# Patient Record
Sex: Male | Born: 1960 | Race: White | Hispanic: No | Marital: Married | Smoking: Never smoker
Health system: Southern US, Community
[De-identification: ages and names within clinical notes are randomized; demographics above are authoritative.]

## PROBLEM LIST (undated history)

## (undated) DIAGNOSIS — I10 Essential (primary) hypertension: Secondary | ICD-10-CM

## (undated) DIAGNOSIS — N200 Calculus of kidney: Secondary | ICD-10-CM

## (undated) HISTORY — PX: OTHER SURGICAL HISTORY: SHX169

## (undated) HISTORY — PX: TONSILLECTOMY: SUR1361

## (undated) HISTORY — PX: CHOLECYSTECTOMY: SHX55

---

## 2004-12-27 ENCOUNTER — Emergency Department: Payer: Self-pay | Admitting: General Practice

## 2005-01-05 ENCOUNTER — Emergency Department: Payer: Self-pay | Admitting: Emergency Medicine

## 2005-01-17 ENCOUNTER — Ambulatory Visit: Payer: Self-pay | Admitting: Specialist

## 2005-11-30 ENCOUNTER — Other Ambulatory Visit: Payer: Self-pay

## 2005-11-30 ENCOUNTER — Emergency Department: Payer: Self-pay | Admitting: Emergency Medicine

## 2005-12-03 ENCOUNTER — Ambulatory Visit: Payer: Self-pay | Admitting: Emergency Medicine

## 2006-06-16 IMAGING — CT CT ABD-PELV W/O CM
1 of 3 series · 15 of 32 positions shown, 19 images · non-contrast
Comparison: none

REASON FOR EXAM: Stone study
COMMENTS:

[Series 3: inspace · axial · 0.81mm/px · z∈[-524,-68]mm · 15 of 712 slices shown, 19 images]
[im 30/712  soft-tissue]
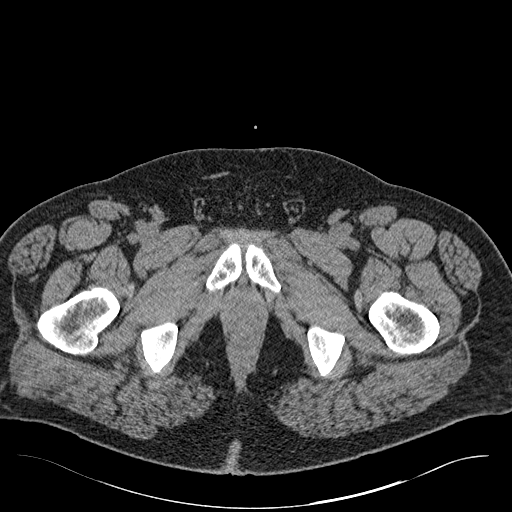
[im 30/712  bone]
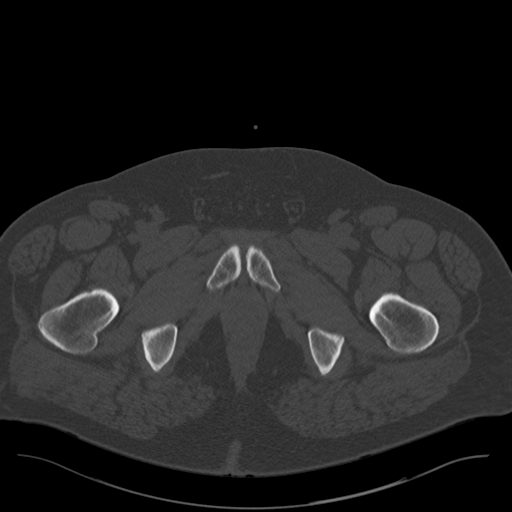
[im 89/712  soft-tissue]
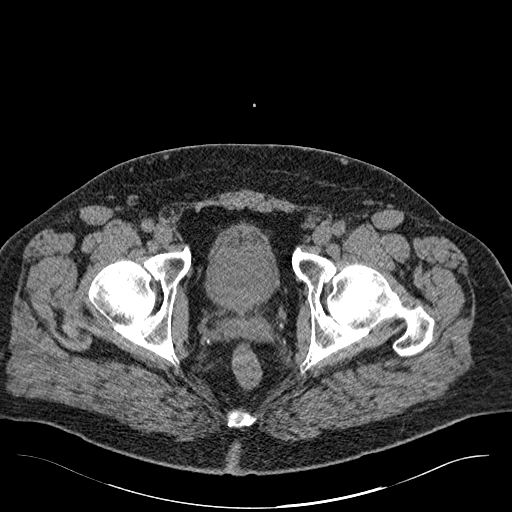
[im 149/712  soft-tissue]
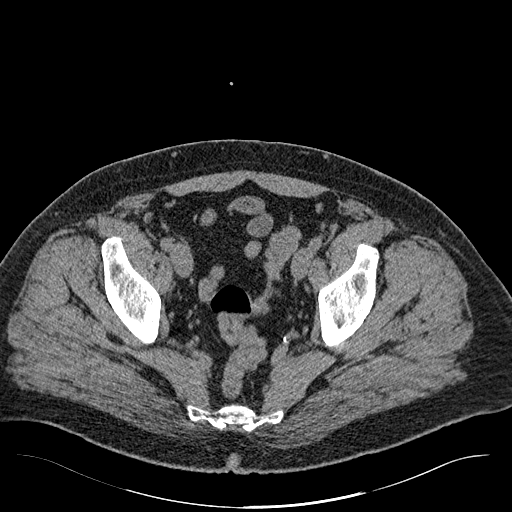
[im 208/712  soft-tissue]
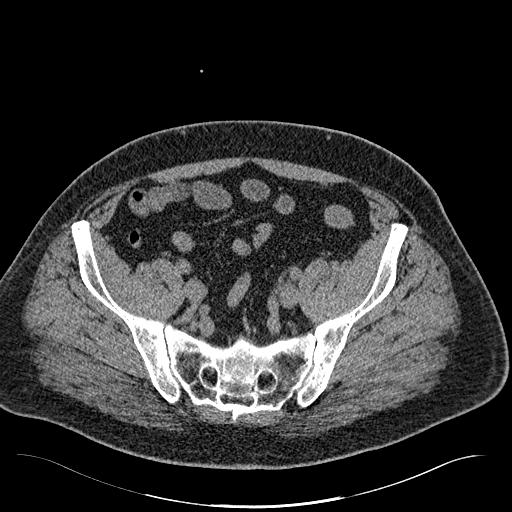
[im 238/712  soft-tissue]
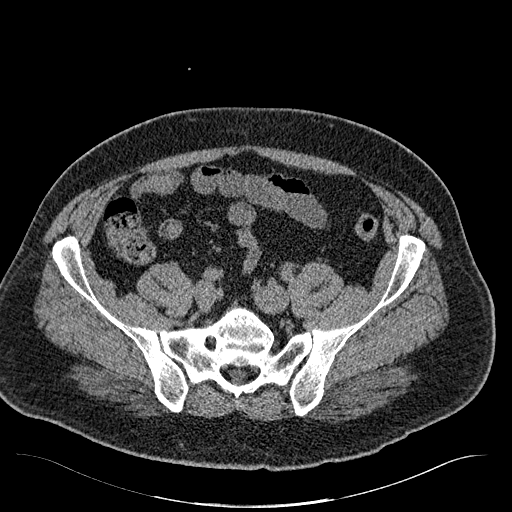
[im 297/712  soft-tissue]
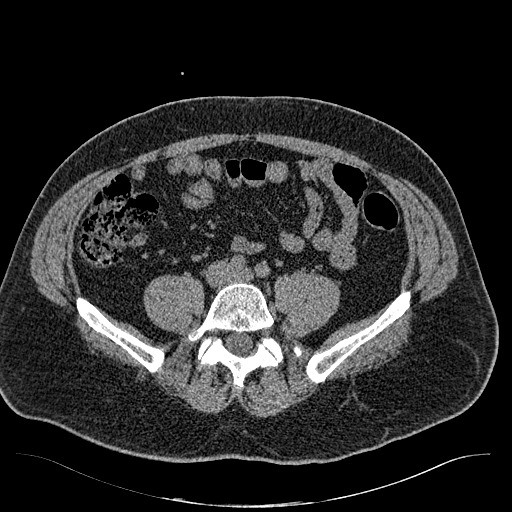
[im 356/712  soft-tissue]
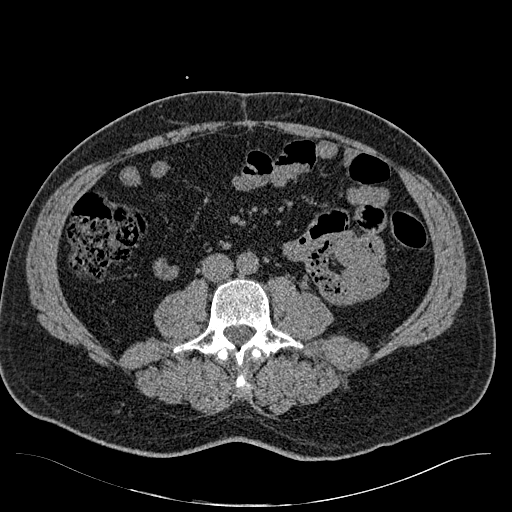
[im 415/712  soft-tissue]
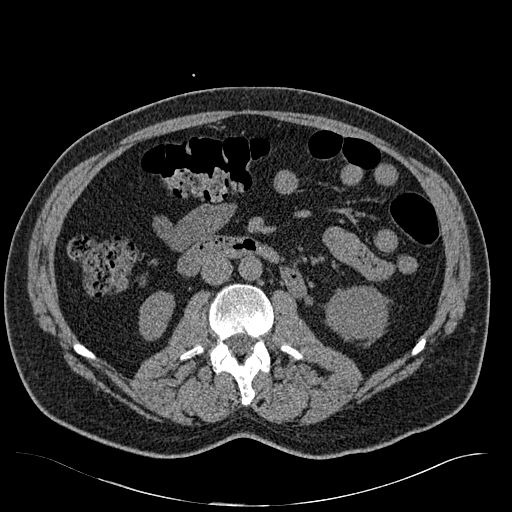
[im 475/712  soft-tissue]
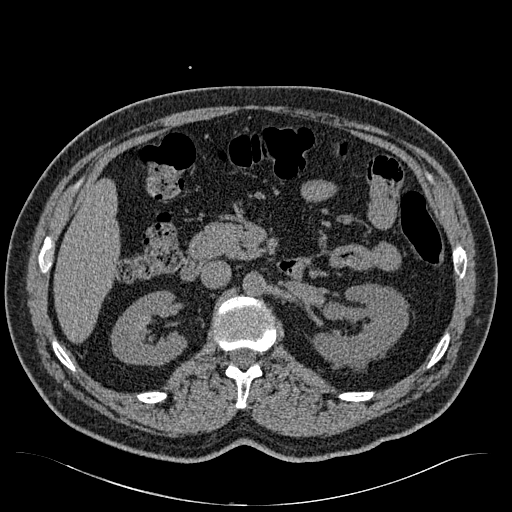
[im 475/712  bone]
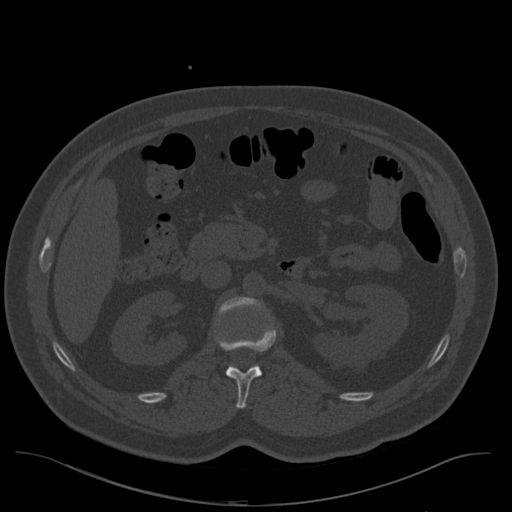
[im 504/712  soft-tissue]
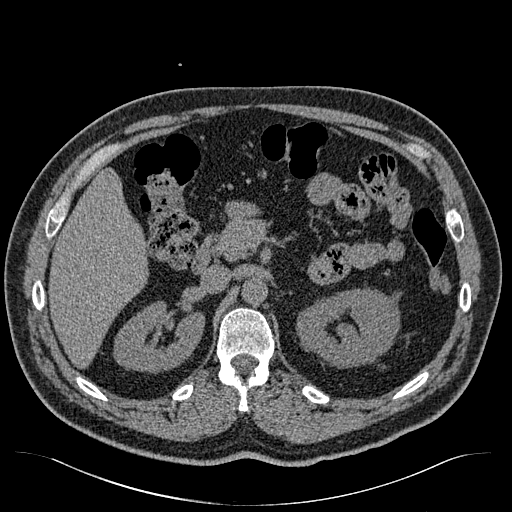
[im 563/712  soft-tissue]
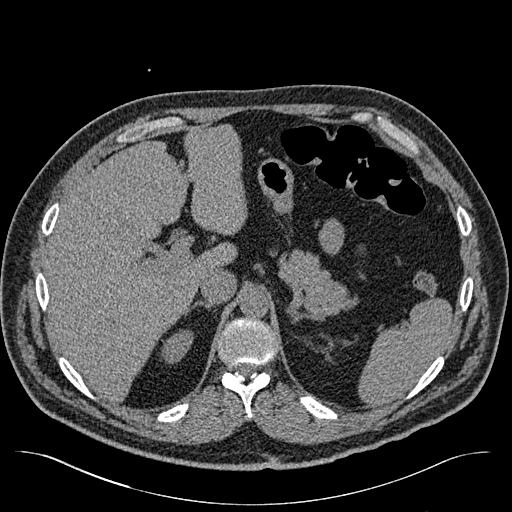
[im 593/712  lung]
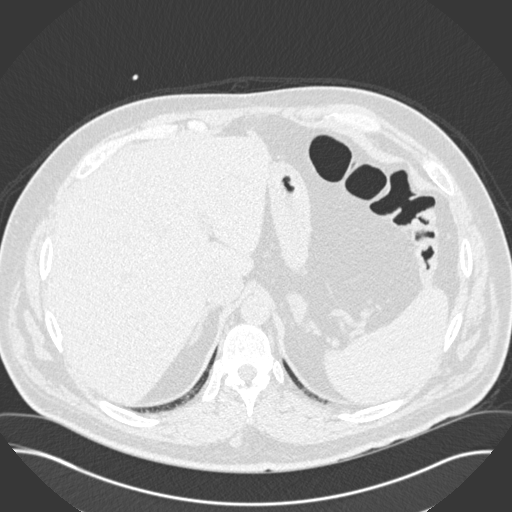
[im 623/712  soft-tissue]
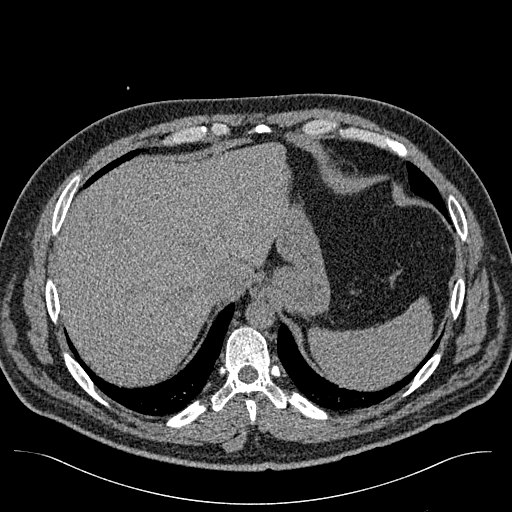
[im 623/712  lung]
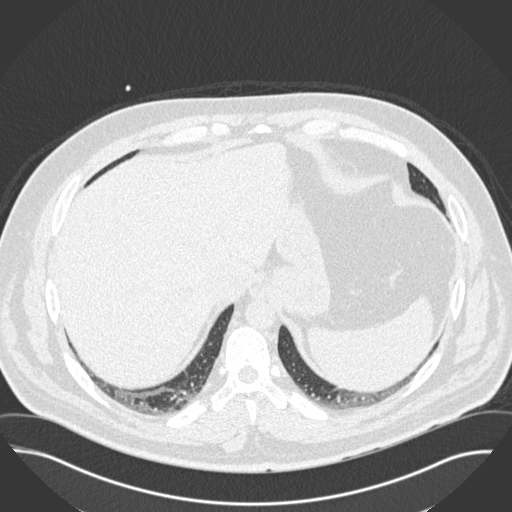
[im 652/712  lung]
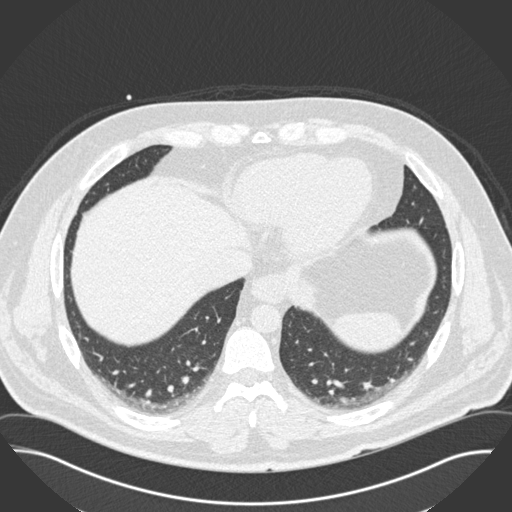
[im 682/712  soft-tissue]
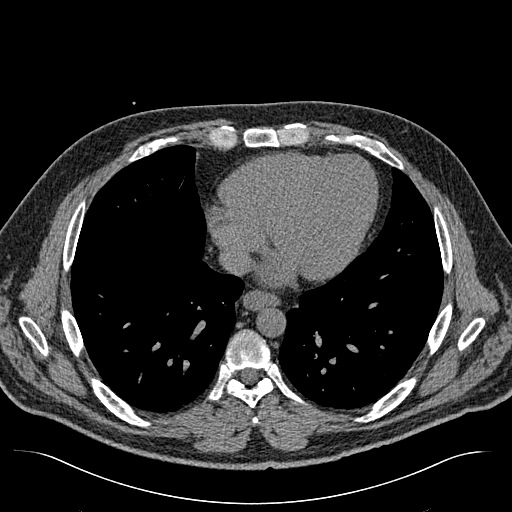
[im 682/712  lung]
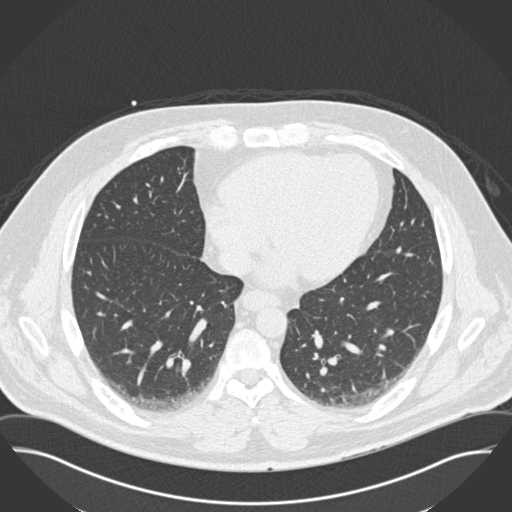

[15 of 32 positions shown; findings below may reference images not displayed]

PROCEDURE:     CT  - CT ABDOMEN AND PELVIS W[DATE]  [DATE]

RESULT:     3-mm unenhanced cuts through the abdomen and pelvis were
performed.  The lung bases show some dependent atelectasis but are otherwise
clear.  Soft tissue windows show solid organs aside from the kidneys to be
free of abnormality though the evaluation of them is somewhat limited due to
lack of IV contrast.  Both kidneys show evidence of bilateral
non-obstructing 1 to 2 mm corticomedullary junction calcifications.  The
RIGHT kidney does not show evidence of hydronephrosis or hydroureter.  The
LEFT kidney, however, does show evidence of grade 2 hydronephrosis and
hydroureter.  Image #115 shows evidence of a 3-mm calcification likely
present within the mid-LEFT ureter.  This is likely causing the obstructive
signs identified around the LEFT kidney.  No free intraperitoneal fluid,
air, or adenopathy is noted.  The bladder distends normally without evidence
of filling defect or wall thickening.  Punctate calcifications are noted
throughout the pelvis but these are felt to be extrinsic to either distal
ureter.  No inguinal mass or adenopathy is noted. No evidence of
diverticular disease is seen in the sigmoid colon.  There is no free pelvic
fluid or air.
IMPRESSION: Image #115 shows a 3-mm calcification present within the mid to distal LEFT
ureter causing grade 2 hydronephrosis, hydroureter, and some subtle
perinephric stranding.

Bilateral non-obstructing 1 to 2 mm calcifications are noted in each kidney.

No free intraperitoneal fluid, air, or adenopathy.

## 2006-09-29 ENCOUNTER — Emergency Department: Payer: Self-pay | Admitting: Emergency Medicine

## 2006-09-29 ENCOUNTER — Other Ambulatory Visit: Payer: Self-pay

## 2006-12-10 ENCOUNTER — Ambulatory Visit: Payer: Self-pay | Admitting: Gastroenterology

## 2007-03-10 ENCOUNTER — Ambulatory Visit: Payer: Self-pay | Admitting: Internal Medicine

## 2009-10-16 ENCOUNTER — Ambulatory Visit: Payer: Self-pay

## 2009-11-19 ENCOUNTER — Emergency Department: Payer: Self-pay | Admitting: Internal Medicine

## 2015-10-26 ENCOUNTER — Emergency Department
Admission: EM | Admit: 2015-10-26 | Discharge: 2015-10-27 | Disposition: A | Discharge status: Home / Self Care | Payer: PRIVATE HEALTH INSURANCE | Attending: Emergency Medicine | Admitting: Emergency Medicine

## 2015-10-26 DIAGNOSIS — F10120 Alcohol abuse with intoxication, uncomplicated: Secondary | ICD-10-CM | POA: Diagnosis not present

## 2015-10-26 DIAGNOSIS — R197 Diarrhea, unspecified: Secondary | ICD-10-CM | POA: Insufficient documentation

## 2015-10-26 DIAGNOSIS — Z79899 Other long term (current) drug therapy: Secondary | ICD-10-CM | POA: Diagnosis not present

## 2015-10-26 DIAGNOSIS — R5383 Other fatigue: Secondary | ICD-10-CM | POA: Insufficient documentation

## 2015-10-26 DIAGNOSIS — R55 Syncope and collapse: Secondary | ICD-10-CM | POA: Diagnosis present

## 2015-10-26 DIAGNOSIS — I10 Essential (primary) hypertension: Secondary | ICD-10-CM | POA: Diagnosis not present

## 2015-10-26 DIAGNOSIS — F10920 Alcohol use, unspecified with intoxication, uncomplicated: Secondary | ICD-10-CM

## 2015-10-27 ENCOUNTER — Emergency Department: Payer: PRIVATE HEALTH INSURANCE

## 2015-10-27 LAB — CBC
HCT: 44.3 % (ref 40.0–52.0)
HEMOGLOBIN: 14.8 g/dL (ref 13.0–18.0)
MCH: 30.7 pg (ref 26.0–34.0)
MCHC: 33.3 g/dL (ref 32.0–36.0)
MCV: 92.2 fL (ref 80.0–100.0)
Platelets: 222 10*3/uL (ref 150–440)
RBC: 4.81 MIL/uL (ref 4.40–5.90)
RDW: 12.3 % (ref 11.5–14.5)
WBC: 6.4 10*3/uL (ref 3.8–10.6)

## 2015-10-27 LAB — CARBOXYHEMOGLOBIN
Carboxyhemoglobin: 1.7 % (ref 1.5–9.0)
O2 Saturation: 94.7 %
Total oxygen content: 95.1 mL/dL

## 2015-10-27 LAB — URINALYSIS COMPLETE WITH MICROSCOPIC (ARMC ONLY)
BILIRUBIN URINE: NEGATIVE
Glucose, UA: NEGATIVE mg/dL
HGB URINE DIPSTICK: NEGATIVE
KETONES UR: NEGATIVE mg/dL
LEUKOCYTES UA: NEGATIVE
NITRITE: NEGATIVE
PH: 6 (ref 5.0–8.0)
Protein, ur: 30 mg/dL — AB
SPECIFIC GRAVITY, URINE: 1.012 (ref 1.005–1.030)

## 2015-10-27 LAB — BLOOD GAS, ARTERIAL
Acid-base deficit: 3.5 mmol/L — ABNORMAL HIGH (ref 0.0–2.0)
Allens test (pass/fail): POSITIVE — AB
BICARBONATE: 22.7 meq/L (ref 21.0–28.0)
FIO2: 0.21
O2 SAT: 94.7 %
PATIENT TEMPERATURE: 37
PO2 ART: 80 mmHg — AB (ref 83.0–108.0)
pCO2 arterial: 44 mmHg (ref 32.0–48.0)
pH, Arterial: 7.32 — ABNORMAL LOW (ref 7.350–7.450)

## 2015-10-27 LAB — URINE DRUG SCREEN, QUALITATIVE (ARMC ONLY)
Amphetamines, Ur Screen: NOT DETECTED
BARBITURATES, UR SCREEN: NOT DETECTED
Benzodiazepine, Ur Scrn: NOT DETECTED
CANNABINOID 50 NG, UR ~~LOC~~: NOT DETECTED
COCAINE METABOLITE, UR ~~LOC~~: NOT DETECTED
MDMA (Ecstasy)Ur Screen: NOT DETECTED
Methadone Scn, Ur: NOT DETECTED
OPIATE, UR SCREEN: NOT DETECTED
PHENCYCLIDINE (PCP) UR S: NOT DETECTED
Tricyclic, Ur Screen: NOT DETECTED

## 2015-10-27 LAB — BASIC METABOLIC PANEL
ANION GAP: 6 (ref 5–15)
BUN: 14 mg/dL (ref 6–20)
CHLORIDE: 109 mmol/L (ref 101–111)
CO2: 24 mmol/L (ref 22–32)
Calcium: 8.3 mg/dL — ABNORMAL LOW (ref 8.9–10.3)
Creatinine, Ser: 0.77 mg/dL (ref 0.61–1.24)
GFR calc Af Amer: >60 mL/min (ref >60)
GFR calc non Af Amer: >60 mL/min (ref >60)
Glucose, Bld: 122 mg/dL — ABNORMAL HIGH (ref 65–99)
POTASSIUM: 4.3 mmol/L (ref 3.5–5.1)
SODIUM: 139 mmol/L (ref 135–145)

## 2015-10-27 LAB — TROPONIN I

## 2015-10-27 LAB — HEPATIC FUNCTION PANEL
ALBUMIN: 3.5 g/dL (ref 3.5–5.0)
ALK PHOS: 40 U/L (ref 38–126)
ALT: 20 U/L (ref 17–63)
AST: 19 U/L (ref 15–41)
Bilirubin, Direct: <0.1 mg/dL — ABNORMAL LOW (ref 0.1–0.5)
TOTAL PROTEIN: 6.3 g/dL — AB (ref 6.5–8.1)
Total Bilirubin: 0.4 mg/dL (ref 0.3–1.2)

## 2015-10-27 LAB — LIPASE, BLOOD: Lipase: 90 U/L — ABNORMAL HIGH (ref 11–51)

## 2015-10-27 LAB — ETHANOL: Alcohol, Ethyl (B): 144 mg/dL — ABNORMAL HIGH (ref <5)

## 2015-10-27 MED ORDER — SODIUM CHLORIDE 0.9 % IV BOLUS (SEPSIS)
1000.0000 mL | Freq: Once | INTRAVENOUS | Status: AC
  Administered 2015-10-27: 1000 mL via INTRAVENOUS

## 2015-10-27 NOTE — ED Notes (Signed)
Patient transported to CT 

## 2015-10-27 NOTE — ED Notes (Signed)
Per EMS, Patient had a near syncope episode. Patient woke up from sleep to use the restroom. Patient states, "I remember walking to the bathroom but that's it. I don't really know If I passed out or not". Patients BS 139. Patient denies any pain at this time. Patient states he had not yet used the restroom and states, "I remember EMS being there in the bathroom with me". Per EMS BP 101/50. IV access was obtained and bolus of NS started. Currently patient is in NSR on monitor, A&O x4. Per Family, patient was in the den and feel to his knees, then feel to the ground and "passed out". Once he "came to" he said that he needed to go to the bathroom. Patient had diarrhea. Family states patient was weak and "worn out" once EMS arrived.

## 2015-10-27 NOTE — ED Provider Notes (Signed)
[facility] Emergency Department Provider Note  ____________________________________________  Time seen: Approximately 12:32 AM  I have reviewed the triage vital signs and the nursing notes.   HISTORY  Chief Complaint Near Syncope    HPI Brian [name] is a 54 y.o. male who presents to the ED from home via EMS with a chief complaint of syncope. 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 Next thing he knew, patient awoke to EMS being in the bathroom with him. Per family, patient walked into the den, fell to his knees and then fell to the ground and passed out for a brief period of time. Daughter states once he came to, patient stated that he needed to go to the bathroom. Wife does note to me that patient had his "usual cocktails" this evening. Upon further questioning, patient admits to drinking 3 vodka cocktails tonight. Denies recent travel or trauma. Denies recent fever, chills, chest pain, shortness of breath, abdominal pain, nausea, vomiting, diarrhea, headache, neck pain, weakness.   Past medical history Hypertension   There are no active problems to display for this patient.   Past Surgical History  Procedure Laterality Date  . Cholecystectomy    . Tonsillectomy    . Reflux sugery     Nissen fundoplication  Current Outpatient Rx  Name  Route  Sig  Dispense  Refill  . cholestyramine (QUESTRAN) 4 G packet   Oral   Take 4 g by mouth daily.         Marland Kitchen. lisinopril (PRINIVIL,ZESTRIL) 40 MG tablet   Oral   Take 40 mg by mouth daily.         Marland Kitchen. PARoxetine (PAXIL) 40 MG tablet   Oral   Take 40 mg by mouth every morning.           Allergies Review of patient's allergies indicates no known allergies.  Family history Mother with "blood sugar issues"  Social History Social History  Substance Use Topics  . Smoking status: Never Smoker   . Smokeless tobacco: Current User     Types: Snuff  . Alcohol Use: Yes     Comment: a cocktail daily    Review of Systems Constitutional: Positive for general fatigue. No fever/chills. Eyes: No visual changes. ENT: No sore throat. Cardiovascular: Denies chest pain. Respiratory: Denies shortness of breath. Gastrointestinal: No abdominal pain.  No nausea, no vomiting.  No diarrhea.  No constipation. Genitourinary: Negative for dysuria. Musculoskeletal: Negative for back pain. Skin: Negative for rash. Neurological: Positive for syncope. Negative for headaches, focal weakness or numbness.  10-point ROS otherwise negative.  ____________________________________________   PHYSICAL EXAM:  VITAL SIGNS: ED Triage Vitals  Enc Vitals Group     BP 10/26/15 2356 110/65 mmHg     Pulse Rate 10/26/15 2356 70     Resp 10/26/15 2356 15     Temp 10/26/15 2356 97.8 F (36.6 C)     Temp Source 10/26/15 2356 Oral     SpO2 10/26/15 2356 100 %     Weight 10/26/15 2356 290 lb (131.543 kg)     Height 10/26/15 2356 6\' 2"  (1.88 m)     Head Cir --      Peak Flow --      Pain Score --      Pain Loc --      Pain Edu? --      Excl. in GC? --  Constitutional: Alert and oriented. Well appearing and in no acute distress. Eyes: Conjunctivae are normal. PERRL. EOMI. Head: Atraumatic. Nose: No congestion/rhinnorhea. Mouth/Throat: Mucous membranes are moist.  Oropharynx non-erythematous. Neck: No stridor.  No cervical spine tenderness to palpation.  No carotid bruits. Cardiovascular: Normal rate, regular rhythm. Grossly normal heart sounds.  Good peripheral circulation. Respiratory: Normal respiratory effort.  No retractions. Lungs CTAB. Gastrointestinal: Soft and nontender. No distention. No abdominal bruits. No CVA tenderness. Musculoskeletal: No lower extremity tenderness nor edema.  No joint effusions. Neurologic:  Normal speech and language. CN II-12 grossly intact. 5/5 motor strength and sensation in all 4 extremities.  Cerebellar exam intact. No gross focal neurologic deficits are appreciated.  Skin:  Skin is warm, dry and intact. No rash noted. Psychiatric: Mood and affect are normal. Speech and behavior are normal.  ____________________________________________   LABS (all labs ordered are listed, but only abnormal results are displayed)  Labs Reviewed  URINALYSIS COMPLETEWITH MICROSCOPIC ([facility] ONLY) - Abnormal; Notable for the following:    Color, Urine YELLOW (*)    APPearance CLEAR (*)    Protein, ur 30 (*)    Bacteria, UA FEW (*)    Squamous Epithelial / LPF 0-5 (*)    All other components within normal limits  ETHANOL - Abnormal; Notable for the following:    Alcohol, Ethyl (B) 144 (*)    All other components within normal limits  BLOOD GAS, ARTERIAL - Abnormal; Notable for the following:    pH, Arterial 7.32 (*)    pO2, Arterial 80 (*)    Acid-base deficit 3.5 (*)    Allens test (pass/fail) POSITIVE (*)    All other components within normal limits  LIPASE, BLOOD - Abnormal; Notable for the following:    Lipase 90 (*)    All other components within normal limits  HEPATIC FUNCTION PANEL - Abnormal; Notable for the following:    Total Protein 6.3 (*)    Bilirubin, Direct <0.1 (*)    All other components within normal limits  BASIC METABOLIC PANEL - Abnormal; Notable for the following:    Glucose, Bld 122 (*)    Calcium 8.3 (*)    All other components within normal limits  CBC  URINE DRUG SCREEN, QUALITATIVE ([facility] ONLY)  CARBOXYHEMOGLOBIN  TROPONIN I   ____________________________________________  EKG  ED ECG REPORT I, Kavaughn Faucett J, the attending physician, personally viewed and interpreted this ECG.   Date: 10/27/2015  EKG Time: 2356  Rate: 70  Rhythm: normal EKG, normal sinus rhythm  Axis: Normal  Intervals:none  ST&T Change: Nonspecific  ____________________________________________  RADIOLOGY  CT head without contrast interpreted per Dr. Cherly Hensen: 1. No evidence of  traumatic intracranial injury or fracture. 2. Mild small vessel ischemic microangiopathy. ____________________________________________   PROCEDURES  Procedure(s) performed: None  Critical Care performed: No  ____________________________________________   INITIAL IMPRESSION / ASSESSMENT AND PLAN / ED COURSE  Pertinent labs & imaging results that were available during my care of the patient were reviewed by me and considered in my medical decision making (see chart for details).  54 year old male who presents with fatigue and syncope. 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 Patient is a daily drinker multiple cocktails, will obtain EtOH along with screening labs, CT head. IV fluid resuscitation initiated; will reassess.  ----------------------------------------- 3:09 AM on 10/27/2015 -----------------------------------------  Patient resting comfortably in no acute distress. Updated patient and spouse of imaging and preliminary laboratory results. Apologized for the delay  in chemistry results.  ----------------------------------------- 3:56 AM on 10/27/2015 -----------------------------------------  Updated patient and spouse of the chemistry results. Labs notable for mild elevation in lipase and EtOH. No irregularities noted on cardiac monitor during patient's ED course. There are no focal neurological deficits on exam. Advised hydration, rest and close follow-up with PCP. Strict return precautions given. Both verbalize understanding and agree with plan of care. ____________________________________________   FINAL CLINICAL IMPRESSION(S) / ED DIAGNOSES  Final diagnoses:  Syncope, unspecified syncope type  Alcohol intoxication, uncomplicated (HCC)      Irean Hong, MD 10/27/15 [phone]

## 2015-10-27 NOTE — Discharge Instructions (Signed)
1. Drink plenty of fluids this weekend. 2. Return to the ER for worsening symptoms, persistent vomiting, lethargy or other concerns.  Syncope Syncope is a medical term for fainting or passing out. This means you lose consciousness and drop to the ground. People are generally unconscious for less than 5 minutes. You may have some muscle twitches for up to 15 seconds before waking up and returning to normal. Syncope occurs more often in older adults, but it can happen to anyone. While most causes of syncope are not dangerous, syncope can be a sign of a serious medical problem. It is important to seek medical care.  CAUSES  Syncope is caused by a sudden drop in blood flow to the brain. The specific cause is often not determined. Factors that can bring on syncope include:  Taking medicines that lower blood pressure.  Sudden changes in posture, such as standing up quickly.  Taking more medicine than prescribed.  Standing in one place for too long.  Seizure disorders.  Dehydration and excessive exposure to heat.  Low blood sugar (hypoglycemia).  Straining to have a bowel movement.  Heart disease, irregular heartbeat, or other circulatory problems.  Fear, emotional distress, seeing blood, or severe pain. SYMPTOMS  Right before fainting, you may:  Feel dizzy or light-headed.  Feel nauseous.  See all white or all black in your field of vision.  Have cold, clammy skin. DIAGNOSIS  Your health care provider will ask about your symptoms, perform a physical exam, and perform an electrocardiogram (ECG) to record the electrical activity of your heart. Your health care provider may also perform other heart or blood tests to determine the cause of your syncope which may include:  Transthoracic echocardiogram (TTE). During echocardiography, sound waves are used to evaluate how blood flows through your heart.  Transesophageal echocardiogram (TEE).  Cardiac monitoring. This allows your health  care provider to monitor your heart rate and rhythm in real time.  Holter monitor. This is a portable device that records your heartbeat and can help diagnose heart arrhythmias. It allows your health care provider to track your heart activity for several days, if needed.  Stress tests by exercise or by giving medicine that makes the heart beat faster. TREATMENT  In most cases, no treatment is needed. Depending on the cause of your syncope, your health care provider may recommend changing or stopping some of your medicines. HOME CARE INSTRUCTIONS  Have someone stay with you until you feel stable.  Do not drive, use machinery, or play sports until your health care provider says it is okay.  Keep all follow-up appointments as directed by your health care provider.  Lie down right away if you start feeling like you might faint. Breathe deeply and steadily. Wait until all the symptoms have passed.  Drink enough fluids to keep your urine clear or pale yellow.  If you are taking blood pressure or heart medicine, get up slowly and take several minutes to sit and then stand. This can reduce dizziness. SEEK IMMEDIATE MEDICAL CARE IF:   You have a severe headache.  You have unusual pain in the chest, abdomen, or back.  You are bleeding from your mouth or rectum, or you have black or tarry stool.  You have an irregular or very fast heartbeat.  You have pain with breathing.  You have repeated fainting or seizure-like jerking during an episode.  You faint when sitting or lying down.  You have confusion.  You have trouble walking.  You  have severe weakness.  You have vision problems. If you fainted, call your local emergency services (911 in [place] Do not drive yourself to the hospital.    This information is not intended to replace advice given to you by your health care provider. Make sure you discuss any questions you have with your health care provider.   Document Released:  11/04/2005 Document Revised: 03/21/2015 Document Reviewed: 01/03/2012 Elsevier Interactive Patient Education 2016 [employer].  Alcohol Intoxication Alcohol intoxication occurs when you drink enough alcohol that it affects your ability to function. It can be mild or very severe. Drinking a lot of alcohol in a short time is called binge drinking. This can be very harmful. Drinking alcohol can also be more dangerous if you are taking medicines or other drugs. Some of the effects caused by alcohol may include:  Loss of coordination.  Changes in mood and behavior.  Unclear thinking.  Trouble talking (slurred speech).  Throwing up (vomiting).  Confusion.  Slowed breathing.  Twitching and shaking (seizures).  Loss of consciousness. HOME CARE  Do not drive after drinking alcohol.  Drink enough water and fluids to keep your pee (urine) clear or pale yellow. Avoid caffeine.  Only take medicine as told by your doctor. GET HELP IF:  You throw up (vomit) many times.  You do not feel better after a few days.  You frequently have alcohol intoxication. Your doctor can help decide if you should see a substance use treatment counselor. GET HELP RIGHT AWAY IF:  You become shaky when you stop drinking.  You have twitching and shaking.  You throw up blood. It may look bright red or like coffee grounds.  You notice blood in your poop (bowel movements).  You become lightheaded or pass out (faint). MAKE SURE YOU:   Understand these instructions.  Will watch your condition.  Will get help right away if you are not doing well or get worse.   This information is not intended to replace advice given to you by your health care provider. Make sure you discuss any questions you have with your health care provider.   Document Released: 04/22/2008 Document Revised: 07/07/2013 Document Reviewed: 04/09/2013 Elsevier Interactive Patient Education [employer]

## 2015-10-27 NOTE — ED Notes (Signed)
RT at bedside for ABG

## 2017-10-08 ENCOUNTER — Emergency Department
Admission: EM | Admit: 2017-10-08 | Discharge: 2017-10-08 | Disposition: A | Discharge status: Home / Self Care | Payer: PRIVATE HEALTH INSURANCE | Attending: Emergency Medicine | Admitting: Emergency Medicine

## 2017-10-08 ENCOUNTER — Encounter: Payer: Self-pay | Admitting: Emergency Medicine

## 2017-10-08 DIAGNOSIS — I10 Essential (primary) hypertension: Secondary | ICD-10-CM | POA: Diagnosis not present

## 2017-10-08 DIAGNOSIS — Z79899 Other long term (current) drug therapy: Secondary | ICD-10-CM | POA: Insufficient documentation

## 2017-10-08 DIAGNOSIS — R1031 Right lower quadrant pain: Secondary | ICD-10-CM | POA: Diagnosis present

## 2017-10-08 DIAGNOSIS — N201 Calculus of ureter: Secondary | ICD-10-CM | POA: Insufficient documentation

## 2017-10-08 HISTORY — DX: Calculus of kidney: N20.0

## 2017-10-08 HISTORY — DX: Essential (primary) hypertension: I10

## 2017-10-08 LAB — URINALYSIS, COMPLETE (UACMP) WITH MICROSCOPIC
BACTERIA UA: NONE SEEN
Bilirubin Urine: NEGATIVE
GLUCOSE, UA: NEGATIVE mg/dL
Ketones, ur: 5 mg/dL — AB
LEUKOCYTES UA: NEGATIVE
NITRITE: NEGATIVE
PROTEIN: 30 mg/dL — AB
Specific Gravity, Urine: 1.025 (ref 1.005–1.030)
Squamous Epithelial / LPF: NONE SEEN
pH: 6 (ref 5.0–8.0)

## 2017-10-08 LAB — BASIC METABOLIC PANEL
Anion gap: 14 (ref 5–15)
BUN: 14 mg/dL (ref 6–20)
CO2: 21 mmol/L — AB (ref 22–32)
Calcium: 8.9 mg/dL (ref 8.9–10.3)
Chloride: 102 mmol/L (ref 101–111)
Creatinine, Ser: 0.85 mg/dL (ref 0.61–1.24)
GFR calc Af Amer: >60 mL/min (ref >60)
Glucose, Bld: 109 mg/dL — ABNORMAL HIGH (ref 65–99)
POTASSIUM: 3.9 mmol/L (ref 3.5–5.1)
Sodium: 137 mmol/L (ref 135–145)

## 2017-10-08 LAB — CBC
HCT: 45.2 % (ref 40.0–52.0)
HEMOGLOBIN: 15.6 g/dL (ref 13.0–18.0)
MCH: 32.1 pg (ref 26.0–34.0)
MCHC: 34.6 g/dL (ref 32.0–36.0)
MCV: 93 fL (ref 80.0–100.0)
PLATELETS: 271 10*3/uL (ref 150–440)
RBC: 4.86 MIL/uL (ref 4.40–5.90)
RDW: 13.1 % (ref 11.5–14.5)
WBC: 9.7 10*3/uL (ref 3.8–10.6)

## 2017-10-08 MED ORDER — OXYCODONE-ACETAMINOPHEN 5-325 MG PO TABS: 1.0000 | ORAL_TABLET | Freq: Four times a day (QID) | ORAL | 0 refills | Status: AC | PRN

## 2017-10-08 MED ORDER — KETOROLAC TROMETHAMINE 30 MG/ML IJ SOLN
INTRAMUSCULAR | Status: AC
  Filled 2017-10-08: qty 1

## 2017-10-08 MED ORDER — TAMSULOSIN HCL 0.4 MG PO CAPS: 0.4000 mg | ORAL_CAPSULE | Freq: Every day | ORAL | 0 refills | Status: DC

## 2017-10-08 MED ORDER — IBUPROFEN 600 MG PO TABS: 600.0000 mg | ORAL_TABLET | Freq: Four times a day (QID) | ORAL | 0 refills | Status: DC | PRN

## 2017-10-08 MED ORDER — KETOROLAC TROMETHAMINE 30 MG/ML IJ SOLN
30.0000 mg | Freq: Once | INTRAMUSCULAR | Status: AC
  Administered 2017-10-08: 30 mg via INTRAVENOUS

## 2017-10-08 MED ORDER — FENTANYL CITRATE (PF) 100 MCG/2ML IJ SOLN
50.0000 ug | INTRAMUSCULAR | Status: DC | PRN
  Administered 2017-10-08: 50 ug via INTRAVENOUS
  Filled 2017-10-08: qty 2

## 2017-10-08 MED ORDER — ONDANSETRON HCL 4 MG/2ML IJ SOLN
4.0000 mg | Freq: Once | INTRAMUSCULAR | Status: AC
  Administered 2017-10-08: 4 mg via INTRAVENOUS
  Filled 2017-10-08: qty 2

## 2017-10-08 NOTE — ED Triage Notes (Signed)
Pt comes into the ED via POV c/o right side flank pain.  H/o kidney stones and patient states this feels the same.  Patient is diaphoretic in triage at this time.  Patient denies any change in the characteristics in his urine but states he is urinating less.

## 2017-10-08 NOTE — ED Notes (Signed)
States feeling better.  Not able to void yet

## 2017-10-08 NOTE — ED Notes (Signed)
ED Provider at bedside. 

## 2017-10-08 NOTE — ED Provider Notes (Signed)
[facility] Emergency Department Provider Note ____________________________________________   First MD Initiated Contact with Patient 10/08/17 1649     (approximate)  I have reviewed the triage vital signs and the nursing notes.   HISTORY  Chief Complaint Flank Pain    HPI Marily LenteJohn A Renwick is a 56 y.o. male with history of hypertension and ureteral stones who presents with right flank pain for the last several hours, acute onset, associated with nausea, and nonradiating.  He states it is identical to pain from prior ureteral stones.  Patient states the last stone was 2-3 years ago, and he has had to have lithotripsy in the past.  Patient denies any urinary symptoms except for difficulty initiating urination.  No fever or chills.  No abdominal pain.   Past Medical History:  Diagnosis Date  . Hypertension   . Kidney stones     There are no active problems to display for this patient.   Past Surgical History:  Procedure Laterality Date  . CHOLECYSTECTOMY    . Reflux sugery    . TONSILLECTOMY      Prior to Admission medications   Medication Sig Start Date End Date Taking? Authorizing Provider  cholestyramine Lanetta Inch(QUESTRAN) 4 G packet Take 4 g by mouth daily.    [provider]  lisinopril (PRINIVIL,ZESTRIL) 40 MG tablet Take 40 mg by mouth daily.    [provider]  PARoxetine (PAXIL) 40 MG tablet Take 40 mg by mouth every morning.    [provider]    Allergies Patient has no known allergies.  No family history on file.  Social History Social History   Tobacco Use  . Smoking status: Never Smoker  . Smokeless tobacco: Current User    Types: Snuff  Substance Use Topics  . Alcohol use: Yes    Comment: a cocktail daily  . Drug use: No    Review of Systems  Constitutional: No fever/chills . Eyes: No visual changes. ENT: No sore throat. Cardiovascular: Denies chest pain. Respiratory: Denies shortness of  breath. Gastrointestinal: Positive for nausea, no vomiting. Genitourinary: Negative for hematuria. Musculoskeletal: Negative for back pain. Skin: Negative for rash. Neurological: Negative for headache.   ____________________________________________   PHYSICAL EXAM:  VITAL SIGNS: ED Triage Vitals [10/08/17 1559]  Enc Vitals Group     BP (!) 141/94     Pulse Rate 88     Resp (!) 22     Temp 98.1 F (36.7 C)     Temp Source Oral     SpO2 100 %     Weight 290 lb (131.5 kg)     Height 6\' 2"  (1.88 m)     Head Circumference      Peak Flow      Pain Score 10     Pain Loc      Pain Edu?      Excl. in GC?     Constitutional: Alert and oriented. Slightly uncomfortable appearing but in no acute distress. Eyes: Conjunctivae are normal.  Head: Atraumatic. Nose: No congestion/rhinnorhea. Mouth/Throat: Mucous membranes are moist.   Neck: Normal range of motion.  Cardiovascular: Good peripheral circulation. Respiratory: Normal respiratory effort.  No retractions. Gastrointestinal: Soft and nontender. No distention.  Genitourinary: No CVA tenderness. Musculoskeletal:  Extremities warm and well perfused.  Neurologic:  Normal speech and language. No gross focal neurologic deficits are appreciated.  Skin:  Skin is warm and dry. No rash noted. Psychiatric: Mood and affect are normal. Speech and  behavior are normal.  ____________________________________________   LABS (all labs ordered are listed, but only abnormal results are displayed)  Labs Reviewed  URINALYSIS, COMPLETE (UACMP) WITH MICROSCOPIC - Abnormal; Notable for the following components:      Result Value   Color, Urine YELLOW (*)    APPearance CLEAR (*)    Hgb urine dipstick SMALL (*)    Ketones, ur 5 (*)    Protein, ur 30 (*)    All other components within normal limits  BASIC METABOLIC PANEL - Abnormal; Notable for the following components:   CO2 21 (*)    Glucose, Bld 109 (*)    All other components within  normal limits  CBC   ____________________________________________  EKG   ____________________________________________  RADIOLOGY    ____________________________________________   PROCEDURES  Procedure(s) performed: No    Critical Care performed: No ____________________________________________   INITIAL IMPRESSION / ASSESSMENT AND PLAN / ED COURSE  Pertinent labs & imaging results that were available during my care of the patient were reviewed by me and considered in my medical decision making (see chart for details).  56 year old male with past history of ureteral stones presents with recurrent right flank pain, similar to prior ureteral stone pain.  On exam, patient is uncomfortable but not acutely ill-appearing, and the remainder of the exam is unremarkable.  The abdomen is soft and nontender.  Vital signs are stable.  Past medical records reviewed in Epic and are noncontributory.  Overall presentation is consistent with ureteral stone.  Lower suspicion for pyelonephritis, diverticulitis, or MSK pain.  No evidence of vascular cause.  Plan: Labs, UA, fluids, analgesia.  If refractory or persistent severe pain, will consider imaging at that time.    ----------------------------------------- 5:32 PM on 10/08/2017 -----------------------------------------  Patient states pain is "all but gone" and appears comfortable.  We will continue to observe for approximately another hour, and if no recurrent severe pain likely discharge home.  If he has a recurrence and requires another dose of IV medication will obtain a CT.  ----------------------------------------- 7:14 PM on 10/08/2017 -----------------------------------------  Patient remains pain-free and feels well to go home.  I am still waiting for his urinalysis to verify that he does not have a UTI, and make sure that it is consistent with a stone.  If this is the case, will discharge  home.  ----------------------------------------- 8:04 PM on 10/08/2017 -----------------------------------------  UA shows blood but no significant number of RBCs or other signs to suggest UTI.  Patient remains comfortable.  Will proceed with discharge. ____________________________________________   FINAL CLINICAL IMPRESSION(S) / ED DIAGNOSES  Final diagnoses:  Ureterolithiasis      NEW MEDICATIONS STARTED DURING THIS VISIT:  This SmartLink is deprecated. Use AVSMEDLIST instead to display the medication list for a patient.   Note:  This document was prepared using Dragon voice recognition software and may include unintentional dictation errors.    Dionne BucySiadecki, Avion Patella, MD 10/08/17 2005

## 2017-10-08 NOTE — Discharge Instructions (Signed)
Follow-up with the urologist within the next week; we have provided a referral.  Return to ER for new, worsening, or persistent pain, pain not relieved by the medications prescribed, fevers, or any other new or worsening symptoms that concern you.

## 2017-10-08 NOTE — ED Notes (Signed)
Pt has right flank pain.  Sx began this afternoon.  Hx kidney stones.  Pt reports dribbling urine.  No v/d.  Iv in place pt alert.  Family with pt.

## 2017-10-08 NOTE — ED Notes (Signed)
Pt unable to void at this time.  Iv fluids infusing.  Pain meds given again .

## 2022-04-24 ENCOUNTER — Other Ambulatory Visit: Payer: Self-pay | Admitting: Nurse Practitioner

## 2022-04-24 DIAGNOSIS — K5900 Constipation, unspecified: Secondary | ICD-10-CM

## 2022-04-24 DIAGNOSIS — R1084 Generalized abdominal pain: Secondary | ICD-10-CM

## 2022-04-30 ENCOUNTER — Ambulatory Visit
Admission: RE | Admit: 2022-04-30 | Discharge: 2022-04-30 | Disposition: A | Discharge status: Home / Self Care | Payer: PRIVATE HEALTH INSURANCE | Source: Ambulatory Visit | Attending: Nurse Practitioner | Admitting: Nurse Practitioner

## 2022-04-30 DIAGNOSIS — R1084 Generalized abdominal pain: Secondary | ICD-10-CM | POA: Insufficient documentation

## 2022-04-30 DIAGNOSIS — K5900 Constipation, unspecified: Secondary | ICD-10-CM | POA: Diagnosis present

## 2022-04-30 LAB — POCT I-STAT CREATININE: Creatinine, Ser: 1.1 mg/dL (ref 0.61–1.24)

## 2022-04-30 MED ORDER — IOHEXOL 300 MG/ML  SOLN
100.0000 mL | Freq: Once | INTRAMUSCULAR | Status: AC | PRN
  Administered 2022-04-30: 100 mL via INTRAVENOUS

## 2022-05-09 LAB — SURGICAL PATHOLOGY

## 2022-11-27 ENCOUNTER — Ambulatory Visit
Admission: RE | Admit: 2022-11-27 | Discharge: 2022-11-27 | Disposition: A | Discharge status: Home / Self Care | Payer: PRIVATE HEALTH INSURANCE | Source: Ambulatory Visit | Attending: Urgent Care | Admitting: Urgent Care

## 2022-11-27 VITALS — BP 155/96 | HR 78 | Temp 98.5°F | Resp 17

## 2022-11-27 DIAGNOSIS — R6889 Other general symptoms and signs: Secondary | ICD-10-CM | POA: Diagnosis not present

## 2022-11-27 MED ORDER — OSELTAMIVIR PHOSPHATE 75 MG PO CAPS: 75.0000 mg | ORAL_CAPSULE | Freq: Two times a day (BID) | ORAL | 0 refills | Status: DC

## 2022-11-27 NOTE — ED Provider Notes (Signed)
[name]    CSN: [id number] Arrival date & time: 11/27/22  1013      History   Chief Complaint Chief Complaint  Patient presents with   Headache   Generalized Body Aches   Abdominal Pain    HPI Brian [name] is a 62 y.o. male.    Headache Associated symptoms: abdominal pain   Abdominal Pain   Presents to urgent care with complaint of headache, stomach pain, body aches x 2 days.  He has no documented fever but endorses chills and sweats.  "Very little cough".  Past Medical History:  Diagnosis Date   Hypertension    Kidney stones     There are no problems to display for this patient.   Past Surgical History:  Procedure Laterality Date   CHOLECYSTECTOMY     Reflux sugery     TONSILLECTOMY         Home Medications    Prior to Admission medications   Medication Sig Start Date End Date Taking? Authorizing Provider  cholestyramine Lucrezia Starch) 4 G packet Take 4 g by mouth daily.    [provider]  ibuprofen (ADVIL,MOTRIN) 600 MG tablet Take 1 tablet (600 mg total) by mouth every 6 (six) hours as needed for moderate pain. 10/08/17   Arta Silence, MD  lisinopril (PRINIVIL,ZESTRIL) 40 MG tablet Take 40 mg by mouth daily.    [provider]  PARoxetine (PAXIL) 40 MG tablet Take 40 mg by mouth every morning.    [provider]  tamsulosin (FLOMAX) 0.4 MG CAPS capsule Take 1 capsule (0.4 mg total) by mouth daily after supper. 10/08/17   Arta Silence, MD    Family History History reviewed. No pertinent family history.  Social History Social History   Tobacco Use   Smoking status: Never   Smokeless tobacco: Current    Types: Snuff  Substance Use Topics   Alcohol use: Yes    Comment: a cocktail daily   Drug use: No     Allergies   Patient has no known allergies.   Review of Systems Review of Systems  Gastrointestinal:  Positive for abdominal pain.  Neurological:  Positive for headaches.      Physical Exam Triage Vital Signs ED Triage Vitals  Enc Vitals Group     BP 11/27/22 1023 (!) 155/96     Pulse Rate 11/27/22 1023 78     Resp 11/27/22 1023 17     Temp 11/27/22 1023 98.5 F (36.9 C)     Temp src --      SpO2 11/27/22 1023 97 %     Weight --      Height --      Head Circumference --      Peak Flow --      Pain Score 11/27/22 1024 0     Pain Loc --      Pain Edu? --      Excl. in [place]? --    No data found.  Updated Vital Signs BP (!) 155/96   Pulse 78   Temp 98.5 F (36.9 C)   Resp 17   SpO2 97%   Visual Acuity Right Eye Distance:   Left Eye Distance:   Bilateral Distance:    Right Eye Near:   Left Eye Near:    Bilateral Near:     Physical Exam Vitals reviewed.  Constitutional:      Appearance: He is ill-appearing.  Cardiovascular:     Rate and  Rhythm: Normal rate and regular rhythm.  Pulmonary:     Effort: Pulmonary effort is normal.     Breath sounds: Normal breath sounds.  Neurological:     Mental Status: He is alert and oriented to person, place, and time.  Psychiatric:        Mood and Affect: Mood normal.        Behavior: Behavior normal.      UC Treatments / Results  Labs (all labs ordered are listed, but only abnormal results are displayed) Labs Reviewed - No data to display  EKG   Radiology No results found.  Procedures Procedures (including critical care time)  Medications Ordered in UC Medications - No data to display  Initial Impression / Assessment and Plan / UC Course  I have reviewed the triage vital signs and the nursing notes.  Pertinent labs & imaging results that were available during my care of the patient were reviewed by me and considered in my medical decision making (see chart for details).   Patient is afebrile here without recent antipyretics. Satting well on room air. Overall is ill appearing, well hydrated, without respiratory distress. Pulmonary exam is unremarkable.  Lungs CTAB without  wheezing, rhonchi, rales.  Symptoms are consistent with an acute viral process including influenza.  Patient endorses negative at home COVID so reasonable to assume influenza and treat presumptively with Tamiflu given he is still within the treatment window.  Otherwise recommending continued use of OTC medication for symptom control.  Final Clinical Impressions(s) / UC Diagnoses   Final diagnoses:  None   Discharge Instructions   None    ED Prescriptions   None    PDMP not reviewed this encounter.   Rose Phi, [place] 11/27/22 [phone]

## 2022-11-27 NOTE — Discharge Instructions (Addendum)
You have been diagnosed with a viral upper respiratory infection based on your symptoms and exam. Viral illnesses cannot be treated with antibiotics - they are self limiting - and you should find your symptoms resolving within a few days. Get plenty of rest and non-caffeinated fluids. Watch for signs of dehydration including reduced urine output and dark colored urine.  I have prescribed Tamiflu, antiviral therapy for influenza A, based on a presumptive diagnosis of influenza.  We recommend you use over-the-counter medications for symptom control including acetaminophen (Tylenol), ibuprofen (Advil/Motrin) or naproxen (Aleve) for throat pain, fever, chills or body aches. You may combine use of acetaminophen and ibuprofen/naproxen if needed.  Some patients find an pain-relieving throat spray such as Chloraseptic to be effective.  Also recommend cold/cough medication containing a cough suppressant such as dextromethorphan, as needed. Please note that some cough medications are not recommended if you suffer from hypertension.    Saline mist spray is helpful for removing excess mucus from your nose.  Room humidifiers are helpful to ease breathing at night. I recommend guaifenesin (Mucinex) with plenty of water throughout the day to help thin and loosen mucus secretions in your respiratory passages.   If appropriate based upon your other medical problems, you might also find relief of nasal/sinus congestion symptoms by using a nasal decongestant such as fluticasone (Flonase ) or pseudoephedrine (Sudafed sinus).  You will need to obtain Sudafed from behind the pharmacist counter.  Speak to the pharmacist to verify that you are not duplicating medications with other over-the-counter formulations that you may be using.     

## 2022-11-27 NOTE — ED Triage Notes (Signed)
Pt. Presents to UC w/ c/o a headache, stomach pain and body aches for the past 2 days.

## 2023-08-13 ENCOUNTER — Ambulatory Visit: Payer: PRIVATE HEALTH INSURANCE

## 2023-08-13 DIAGNOSIS — K6389 Other specified diseases of intestine: Secondary | ICD-10-CM | POA: Diagnosis not present

## 2023-08-13 DIAGNOSIS — Z1211 Encounter for screening for malignant neoplasm of colon: Secondary | ICD-10-CM | POA: Diagnosis present

## 2023-08-13 DIAGNOSIS — Z86010 Personal history of colonic polyps: Secondary | ICD-10-CM | POA: Diagnosis not present

## 2023-08-13 DIAGNOSIS — K642 Third degree hemorrhoids: Secondary | ICD-10-CM | POA: Diagnosis not present

## 2023-08-13 DIAGNOSIS — K621 Rectal polyp: Secondary | ICD-10-CM | POA: Diagnosis not present

## 2023-08-13 DIAGNOSIS — D128 Benign neoplasm of rectum: Secondary | ICD-10-CM | POA: Diagnosis not present

## 2023-08-13 DIAGNOSIS — K5730 Diverticulosis of large intestine without perforation or abscess without bleeding: Secondary | ICD-10-CM | POA: Diagnosis not present

## 2023-08-13 DIAGNOSIS — D123 Benign neoplasm of transverse colon: Secondary | ICD-10-CM | POA: Diagnosis not present

## 2024-06-08 ENCOUNTER — Other Ambulatory Visit: Payer: Self-pay | Admitting: Internal Medicine

## 2024-06-08 DIAGNOSIS — R1010 Upper abdominal pain, unspecified: Secondary | ICD-10-CM

## 2024-06-08 DIAGNOSIS — K5909 Other constipation: Secondary | ICD-10-CM

## 2024-06-08 DIAGNOSIS — R634 Abnormal weight loss: Secondary | ICD-10-CM

## 2024-06-14 ENCOUNTER — Ambulatory Visit
Admission: RE | Admit: 2024-06-14 | Discharge: 2024-06-14 | Disposition: A | Discharge status: Home / Self Care | Payer: PRIVATE HEALTH INSURANCE | Source: Ambulatory Visit | Attending: Internal Medicine | Admitting: Internal Medicine

## 2024-06-14 DIAGNOSIS — K5909 Other constipation: Secondary | ICD-10-CM | POA: Insufficient documentation

## 2024-06-14 DIAGNOSIS — R1010 Upper abdominal pain, unspecified: Secondary | ICD-10-CM | POA: Diagnosis present

## 2024-06-14 DIAGNOSIS — R634 Abnormal weight loss: Secondary | ICD-10-CM | POA: Diagnosis present

## 2024-06-14 MED ORDER — IOHEXOL 300 MG/ML  SOLN
100.0000 mL | Freq: Once | INTRAMUSCULAR | Status: AC | PRN
  Administered 2024-06-14: 100 mL via INTRAVENOUS

## 2024-08-09 ENCOUNTER — Inpatient Hospital Stay
Admission: EM | Admit: 2024-08-09 | Discharge: 2024-08-12 | DRG: 175 | Disposition: A | Discharge status: Home / Self Care | Payer: PRIVATE HEALTH INSURANCE | Attending: Student | Admitting: Student

## 2024-08-09 ENCOUNTER — Emergency Department: Payer: PRIVATE HEALTH INSURANCE

## 2024-08-09 ENCOUNTER — Other Ambulatory Visit (HOSPITAL_COMMUNITY): Payer: Self-pay

## 2024-08-09 ENCOUNTER — Inpatient Hospital Stay: Payer: PRIVATE HEALTH INSURANCE

## 2024-08-09 ENCOUNTER — Other Ambulatory Visit: Payer: Self-pay

## 2024-08-09 ENCOUNTER — Telehealth (HOSPITAL_COMMUNITY): Payer: Self-pay | Admitting: Pharmacy Technician

## 2024-08-09 DIAGNOSIS — Z87891 Personal history of nicotine dependence: Secondary | ICD-10-CM | POA: Diagnosis not present

## 2024-08-09 DIAGNOSIS — Z9049 Acquired absence of other specified parts of digestive tract: Secondary | ICD-10-CM | POA: Diagnosis not present

## 2024-08-09 DIAGNOSIS — I2694 Multiple subsegmental pulmonary emboli without acute cor pulmonale: Secondary | ICD-10-CM | POA: Diagnosis present

## 2024-08-09 DIAGNOSIS — R109 Unspecified abdominal pain: Secondary | ICD-10-CM | POA: Insufficient documentation

## 2024-08-09 DIAGNOSIS — Z79899 Other long term (current) drug therapy: Secondary | ICD-10-CM

## 2024-08-09 DIAGNOSIS — E66812 Obesity, class 2: Secondary | ICD-10-CM | POA: Diagnosis present

## 2024-08-09 DIAGNOSIS — G8929 Other chronic pain: Secondary | ICD-10-CM | POA: Diagnosis present

## 2024-08-09 DIAGNOSIS — F1010 Alcohol abuse, uncomplicated: Secondary | ICD-10-CM | POA: Diagnosis present

## 2024-08-09 DIAGNOSIS — I82402 Acute embolism and thrombosis of unspecified deep veins of left lower extremity: Secondary | ICD-10-CM | POA: Diagnosis present

## 2024-08-09 DIAGNOSIS — R001 Bradycardia, unspecified: Secondary | ICD-10-CM | POA: Diagnosis not present

## 2024-08-09 DIAGNOSIS — K2980 Duodenitis without bleeding: Secondary | ICD-10-CM | POA: Diagnosis present

## 2024-08-09 DIAGNOSIS — T39395A Adverse effect of other nonsteroidal anti-inflammatory drugs [NSAID], initial encounter: Secondary | ICD-10-CM | POA: Diagnosis present

## 2024-08-09 DIAGNOSIS — Z87442 Personal history of urinary calculi: Secondary | ICD-10-CM | POA: Diagnosis not present

## 2024-08-09 DIAGNOSIS — I2699 Other pulmonary embolism without acute cor pulmonale: Secondary | ICD-10-CM | POA: Insufficient documentation

## 2024-08-09 DIAGNOSIS — K265 Chronic or unspecified duodenal ulcer with perforation: Secondary | ICD-10-CM | POA: Diagnosis present

## 2024-08-09 DIAGNOSIS — R1013 Epigastric pain: Secondary | ICD-10-CM | POA: Diagnosis not present

## 2024-08-09 DIAGNOSIS — E876 Hypokalemia: Secondary | ICD-10-CM | POA: Diagnosis present

## 2024-08-09 DIAGNOSIS — E871 Hypo-osmolality and hyponatremia: Secondary | ICD-10-CM | POA: Diagnosis present

## 2024-08-09 DIAGNOSIS — I10 Essential (primary) hypertension: Secondary | ICD-10-CM | POA: Diagnosis present

## 2024-08-09 DIAGNOSIS — Z6836 Body mass index (BMI) 36.0-36.9, adult: Secondary | ICD-10-CM | POA: Diagnosis not present

## 2024-08-09 DIAGNOSIS — R935 Abnormal findings on diagnostic imaging of other abdominal regions, including retroperitoneum: Secondary | ICD-10-CM | POA: Diagnosis not present

## 2024-08-09 LAB — HEPATIC FUNCTION PANEL
ALT: 12 U/L (ref 0–44)
AST: 19 U/L (ref 15–41)
Albumin: 3.3 g/dL — ABNORMAL LOW (ref 3.5–5.0)
Alkaline Phosphatase: 63 U/L (ref 38–126)
Bilirubin, Direct: 0.3 mg/dL — ABNORMAL HIGH (ref 0.0–0.2)
Indirect Bilirubin: 0.8 mg/dL (ref 0.3–0.9)
Total Bilirubin: 1.1 mg/dL (ref 0.0–1.2)
Total Protein: 6.7 g/dL (ref 6.5–8.1)

## 2024-08-09 LAB — D-DIMER, QUANTITATIVE: D-Dimer, Quant: 1.25 ug{FEU}/mL — ABNORMAL HIGH (ref 0.00–0.50)

## 2024-08-09 LAB — BASIC METABOLIC PANEL WITH GFR
Anion gap: 16 — ABNORMAL HIGH (ref 5–15)
BUN: 8 mg/dL (ref 8–23)
CO2: 26 mmol/L (ref 22–32)
Calcium: 8.9 mg/dL (ref 8.9–10.3)
Chloride: 89 mmol/L — ABNORMAL LOW (ref 98–111)
Creatinine, Ser: 0.56 mg/dL — ABNORMAL LOW (ref 0.61–1.24)
GFR, Estimated: >60 mL/min (ref >60)
Glucose, Bld: 113 mg/dL — ABNORMAL HIGH (ref 70–99)
Potassium: 3.2 mmol/L — ABNORMAL LOW (ref 3.5–5.1)
Sodium: 131 mmol/L — ABNORMAL LOW (ref 135–145)

## 2024-08-09 LAB — PROTIME-INR
INR: 1 (ref 0.8–1.2)
Prothrombin Time: 13.2 s (ref 11.4–15.2)

## 2024-08-09 LAB — CBC
HCT: 52.9 % — ABNORMAL HIGH (ref 39.0–52.0)
Hemoglobin: 18.6 g/dL — ABNORMAL HIGH (ref 13.0–17.0)
MCH: 32.2 pg (ref 26.0–34.0)
MCHC: 35.2 g/dL (ref 30.0–36.0)
MCV: 91.7 fL (ref 80.0–100.0)
Platelets: 292 K/uL (ref 150–400)
RBC: 5.77 MIL/uL (ref 4.22–5.81)
RDW: 13.2 % (ref 11.5–15.5)
WBC: 10.1 K/uL (ref 4.0–10.5)
nRBC: 0 % (ref 0.0–0.2)

## 2024-08-09 LAB — HIV ANTIBODY (ROUTINE TESTING W REFLEX): HIV Screen 4th Generation wRfx: NONREACTIVE

## 2024-08-09 LAB — HEPARIN LEVEL (UNFRACTIONATED)
Heparin Unfractionated: 0.35 [IU]/mL (ref 0.30–0.70)
Heparin Unfractionated: 0.21 [IU]/mL — ABNORMAL LOW (ref 0.30–0.70)

## 2024-08-09 LAB — TROPONIN I (HIGH SENSITIVITY)
Troponin I (High Sensitivity): 5 ng/L (ref <18)
Troponin I (High Sensitivity): 5 ng/L (ref <18)

## 2024-08-09 LAB — LIPASE, BLOOD: Lipase: 61 U/L — ABNORMAL HIGH (ref 11–51)

## 2024-08-09 LAB — APTT: aPTT: 26 s (ref 24–36)

## 2024-08-09 LAB — MAGNESIUM: Magnesium: 1.9 mg/dL (ref 1.7–2.4)

## 2024-08-09 MED ORDER — ADULT MULTIVITAMIN W/MINERALS CH
1.0000 | ORAL_TABLET | Freq: Every day | ORAL | Status: DC
  Administered 2024-08-09 – 2024-08-12 (×4): 1 via ORAL
  Filled 2024-08-09 (×4): qty 1

## 2024-08-09 MED ORDER — LINACLOTIDE 145 MCG PO CAPS: 145.0000 ug | ORAL_CAPSULE | Freq: Every day | ORAL | Status: DC | PRN

## 2024-08-09 MED ORDER — ACETAMINOPHEN 650 MG RE SUPP: 650.0000 mg | Freq: Four times a day (QID) | RECTAL | Status: DC | PRN

## 2024-08-09 MED ORDER — HEPARIN BOLUS VIA INFUSION
1650.0000 [IU] | Freq: Once | INTRAVENOUS | Status: AC
  Administered 2024-08-09: 1650 [IU] via INTRAVENOUS
  Filled 2024-08-09: qty 1650

## 2024-08-09 MED ORDER — PANTOPRAZOLE SODIUM 40 MG IV SOLR
40.0000 mg | Freq: Once | INTRAVENOUS | Status: AC
  Administered 2024-08-09: 40 mg via INTRAVENOUS
  Filled 2024-08-09: qty 10

## 2024-08-09 MED ORDER — THIAMINE HCL 100 MG/ML IJ SOLN
100.0000 mg | Freq: Every day | INTRAMUSCULAR | Status: DC
  Administered 2024-08-09: 100 mg via INTRAVENOUS
  Filled 2024-08-09: qty 2

## 2024-08-09 MED ORDER — LORAZEPAM 1 MG PO TABS: 1.0000 mg | ORAL_TABLET | ORAL | Status: DC | PRN

## 2024-08-09 MED ORDER — ACETAMINOPHEN 325 MG PO TABS: 650.0000 mg | ORAL_TABLET | Freq: Four times a day (QID) | ORAL | Status: DC | PRN

## 2024-08-09 MED ORDER — ONDANSETRON HCL 4 MG/2ML IJ SOLN
4.0000 mg | Freq: Four times a day (QID) | INTRAMUSCULAR | Status: DC | PRN
  Administered 2024-08-10: 4 mg via INTRAVENOUS
  Filled 2024-08-09: qty 2

## 2024-08-09 MED ORDER — POTASSIUM CHLORIDE CRYS ER 20 MEQ PO TBCR
40.0000 meq | EXTENDED_RELEASE_TABLET | Freq: Once | ORAL | Status: AC
  Administered 2024-08-09: 40 meq via ORAL
  Filled 2024-08-09: qty 2

## 2024-08-09 MED ORDER — HYDROMORPHONE HCL 1 MG/ML IJ SOLN
0.5000 mg | INTRAMUSCULAR | Status: DC | PRN
  Administered 2024-08-09: 1 mg via INTRAVENOUS
  Filled 2024-08-09: qty 1

## 2024-08-09 MED ORDER — PAROXETINE HCL 20 MG PO TABS
40.0000 mg | ORAL_TABLET | ORAL | Status: DC
Start: 2024-08-10 — End: 2024-08-11
  Administered 2024-08-10 – 2024-08-11 (×2): 40 mg via ORAL
  Filled 2024-08-09 (×2): qty 2

## 2024-08-09 MED ORDER — ONDANSETRON HCL 4 MG PO TABS: 4.0000 mg | ORAL_TABLET | Freq: Four times a day (QID) | ORAL | Status: DC | PRN

## 2024-08-09 MED ORDER — ESCITALOPRAM OXALATE 10 MG PO TABS
10.0000 mg | ORAL_TABLET | Freq: Every day | ORAL | Status: DC
  Administered 2024-08-10 – 2024-08-12 (×3): 10 mg via ORAL
  Filled 2024-08-09 (×3): qty 1

## 2024-08-09 MED ORDER — PANTOPRAZOLE SODIUM 40 MG IV SOLR
40.0000 mg | Freq: Two times a day (BID) | INTRAVENOUS | Status: DC
  Administered 2024-08-09 – 2024-08-12 (×6): 40 mg via INTRAVENOUS
  Filled 2024-08-09 (×6): qty 10

## 2024-08-09 MED ORDER — SODIUM CHLORIDE 0.9 % IV SOLN: INTRAVENOUS | Status: AC

## 2024-08-09 MED ORDER — HYDRALAZINE HCL 20 MG/ML IJ SOLN
5.0000 mg | Freq: Four times a day (QID) | INTRAMUSCULAR | Status: DC | PRN
Start: 2024-08-09 — End: 2024-08-12

## 2024-08-09 MED ORDER — HEPARIN BOLUS VIA INFUSION
6500.0000 [IU] | Freq: Once | INTRAVENOUS | Status: AC
  Administered 2024-08-09: 6500 [IU] via INTRAVENOUS
  Filled 2024-08-09: qty 6500

## 2024-08-09 MED ORDER — MORPHINE SULFATE (PF) 4 MG/ML IV SOLN
4.0000 mg | Freq: Once | INTRAVENOUS | Status: AC
  Administered 2024-08-09: 4 mg via INTRAVENOUS
  Filled 2024-08-09: qty 1

## 2024-08-09 MED ORDER — IOHEXOL 350 MG/ML SOLN
100.0000 mL | Freq: Once | INTRAVENOUS | Status: AC | PRN
Start: 2024-08-09 — End: 2024-08-09
  Administered 2024-08-09: 100 mL via INTRAVENOUS

## 2024-08-09 MED ORDER — LORAZEPAM 2 MG/ML IJ SOLN: 1.0000 mg | INTRAMUSCULAR | Status: DC | PRN

## 2024-08-09 MED ORDER — POTASSIUM CHLORIDE CRYS ER 20 MEQ PO TBCR
40.0000 meq | EXTENDED_RELEASE_TABLET | Freq: Once | ORAL | Status: DC
  Filled 2024-08-09: qty 2

## 2024-08-09 MED ORDER — FOLIC ACID 1 MG PO TABS
1.0000 mg | ORAL_TABLET | Freq: Every day | ORAL | Status: DC
  Administered 2024-08-09 – 2024-08-12 (×4): 1 mg via ORAL
  Filled 2024-08-09 (×4): qty 1

## 2024-08-09 MED ORDER — THIAMINE MONONITRATE 100 MG PO TABS
100.0000 mg | ORAL_TABLET | Freq: Every day | ORAL | Status: DC
  Administered 2024-08-10 – 2024-08-12 (×3): 100 mg via ORAL
  Filled 2024-08-09 (×4): qty 1

## 2024-08-09 MED ORDER — HEPARIN (PORCINE) 25000 UT/250ML-% IV SOLN
2350.0000 [IU]/h | INTRAVENOUS | Status: DC
  Administered 2024-08-09: 1750 [IU]/h via INTRAVENOUS
  Administered 2024-08-10 – 2024-08-12 (×5): 2350 [IU]/h via INTRAVENOUS
  Filled 2024-08-09 (×7): qty 250

## 2024-08-09 NOTE — ED Triage Notes (Signed)
 Pt to ED via POV from home. Pt ambulatory to triage. Pt reports having upper abd pain and right sided CP that has been intermittent all weekend. Pt reports hx of esophageal surgery and gallbladder removal. Denies cardiac hx. Pain 8/10. No blood thinners.

## 2024-08-09 NOTE — Progress Notes (Signed)
   08/09/24 1215  Spiritual Encounters  Type of Visit Initial  Care provided to: Pt and family  Conversation partners present during encounter Nurse  Reason for visit Routine spiritual support  OnCall Visit No   Chaplain found out patient's room for daughter who was with her mother in another ED room.  Chaplain brought daughter's personal items to her and greeted patient who was very welcoming and engaging.  Patient and daughter said they were fine and Chaplain shared that if their needs changed, they could have Chaplains paged to return to offer support.    Rev. Rana M. Nicholaus, M.Div. Chaplain Resident  [facility]

## 2024-08-09 NOTE — Progress Notes (Signed)
 PHARMACY - ANTICOAGULATION CONSULT NOTE  Pharmacy Consult for Heparin  Infusion Indication: pulmonary embolus  No Known Allergies  Patient Measurements: Height: 6' 2 (188 cm) Weight: 129.3 kg (285 lb) IBW/kg (Calculated) : 82.2 HEPARIN  DW (KG): 110.7  Vital Signs: Temp: 98.7 F (37.1 C) (09/22 1841) Temp Source: Oral (09/22 1841) BP: 132/82 (09/22 1841) Pulse Rate: 68 (09/22 1841)  Labs: Recent Labs    08/09/24 0738 08/09/24 1030 08/09/24 1125 08/09/24 1614  HGB 18.6*  --   --   --   HCT 52.9*  --   --   --   PLT 292  --   --   --   APTT  --   --  26  --   LABPROT  --   --  13.2  --   INR  --   --  1.0  --   HEPARINUNFRC  --   --   --  0.35  CREATININE 0.56*  --   --   --   TROPONINIHS 5 5  --   --     Estimated Creatinine Clearance: 135 mL/min (A) (by C-G formula based on SCr of 0.56 mg/dL (L)).   Medical History: Past Medical History:  Diagnosis Date   Hypertension    Kidney stones     Medications:  Not on anticoagulation at home per chart review  Assessment: Patient is a 63 year old male with a past medical history of hypertension, and cholecystectomy who presented to the ED with right-sided pain. Patient has elevated D-dimer of 1.25 and CT chest shows small burden pulmonary emboli in the right middle and right lower lobes. Pharmacy was consulted to initiate patient on a heparin  infusion.  Baseline INR and aPTT ordered. Hgb 18.6. PLT 292. No signs/symptoms of bleeding noted in chart.   Date Time Results Comments 9/22 1614 HL=0.35 Therapeutic x 1, rate 1750 units/hr (done early)  Goal of Therapy:  Heparin  level 0.3-0.7 units/ml Monitor platelets by anticoagulation protocol: Yes   Plan:  Continue heparin  infusion at 1750 units/hr Check heparin  level in 6 hours to confirm therapeutic rate. Monitor CBC daily while on heparin  Monitor for appropriate transition to oral anticoagulation   Jehiel Koepp Rodriguez-Guzman PharmD, BCPS 08/09/2024 7:10 PM

## 2024-08-09 NOTE — ED Notes (Signed)
 Attempted to call primary RN to inform pt being roomed

## 2024-08-09 NOTE — Progress Notes (Signed)
 PHARMACY - ANTICOAGULATION CONSULT NOTE  Pharmacy Consult for Heparin  Infusion Indication: pulmonary embolus  No Known Allergies  Patient Measurements: Height: 6' 2 (188 cm) Weight: 129.3 kg (285 lb) IBW/kg (Calculated) : 82.2 HEPARIN  DW (KG): 110.7  Vital Signs: Temp: 98.1 F (36.7 C) (09/22 2002) Temp Source: Oral (09/22 1841) BP: 131/73 (09/22 2002) Pulse Rate: 61 (09/22 2002)  Labs: Recent Labs    08/09/24 0738 08/09/24 1030 08/09/24 1125 08/09/24 1614 08/09/24 2153  HGB 18.6*  --   --   --   --   HCT 52.9*  --   --   --   --   PLT 292  --   --   --   --   APTT  --   --  26  --   --   LABPROT  --   --  13.2  --   --   INR  --   --  1.0  --   --   HEPARINUNFRC  --   --   --  0.35 0.21*  CREATININE 0.56*  --   --   --   --   TROPONINIHS 5 5  --   --   --     Estimated Creatinine Clearance: 135 mL/min (A) (by C-G formula based on SCr of 0.56 mg/dL (L)).   Medical History: Past Medical History:  Diagnosis Date   Hypertension    Kidney stones     Medications:  Not on anticoagulation at home per chart review  Assessment: Patient is a 63 year old male with a past medical history of hypertension, and cholecystectomy who presented to the ED with right-sided pain. Patient has elevated D-dimer of 1.25 and CT chest shows small burden pulmonary emboli in the right middle and right lower lobes. Pharmacy was consulted to initiate patient on a heparin  infusion.  Baseline INR and aPTT ordered. Hgb 18.6. PLT 292. No signs/symptoms of bleeding noted in chart.   Date Time Results Comments 9/22 1614 HL=0.35 Therapeutic x 1, rate 1750 units/hr (done early) 9/22     2153   HL=0.21           SUBtherapeutic   Goal of Therapy:  Heparin  level 0.3-0.7 units/ml Monitor platelets by anticoagulation protocol: Yes   Plan:  9/22:  HL @ 2153 = 0.21, SUBtherapeutic - Will order heparin  1650 units IV X 1 bolus and increase drip rate to 1950 units/hr - Recheck HL 6 hrs after rate  change  Monitor CBC daily while on heparin  Monitor for appropriate transition to oral anticoagulation   Brian [name] D 08/09/2024 10:27 PM

## 2024-08-09 NOTE — Consult Note (Signed)
 Brian Copping, MD [facility]  [address] [place], [place] [address] Phone: [phone] Fax : [phone]  Consultation  Referring Provider:     Dr. Laurita Primary Care Physician:  Sadie Manna, MD Primary Gastroenterologist:  Dr. Onita         Reason for Consultation:     Abnormal CT scan  Date of Admission:  08/09/2024 Date of Consultation:  08/09/2024         HPI:   Brian [name] is a 63 y.o. male who was admitted with chest pain with a history of hypertension cholecystectomy and reports the pain to be right sided pain.  The patient reports that the pain has been present for the last few weeks and has also been located in the epigastric area.  He reports having a colonoscopy by Dr. Onita in the past with a large polyp seen and he was sent to a tertiary care center to have that lesion removed.  IMPRESSION: Moderate wall thickening inflammation surrounding the first and second portions of the duodenum. A couple of outpouchings noted medially in the first and second portions, measuring up to 3.1 cm (axial 30). Differential considerations include a couple of duodenum ulcers, duodenum diverticulitis, or even duodenitis with contained perforation. No pneumoperitoneum. Surgical and GI consultation recommended.  On admission the patient had a PE scan with a diagnosis of a PE and was started on heparin .  Due to the findings of the thickened duodenal wall a GI consult and surgical consults were recommended.  The patient does report that he takes Advil  on a daily basis.  The patient also reports that he has 1 drink of liquor every night.  The patient also is having a Doppler ultrasound of his lower extremities while I am interviewing him today to rule out a DVT.  Past Medical History:  Diagnosis Date   Hypertension    Kidney stones     Past Surgical History:  Procedure Laterality Date   CHOLECYSTECTOMY     Reflux sugery     TONSILLECTOMY      Prior to Admission  medications   Medication Sig Start Date End Date Taking? Authorizing Provider  escitalopram  (LEXAPRO ) 10 MG tablet Take 10 mg by mouth daily.   Yes [provider]  hydrochlorothiazide (HYDRODIURIL) 12.5 MG tablet Take 12.5 mg by mouth daily.   Yes [provider]  linaclotide  (LINZESS ) 145 MCG CAPS capsule Take 145 mcg by mouth daily as needed. 07/12/24  Yes [provider]  lisinopril (PRINIVIL,ZESTRIL) 40 MG tablet Take 40 mg by mouth daily.   Yes [provider]  PARoxetine  (PAXIL ) 40 MG tablet Take 40 mg by mouth every morning.   Yes [provider]  Vitamin D , Ergocalciferol , (DRISDOL) 1.25 MG (50000 UNIT) CAPS capsule Take 50,000 Units by mouth every 7 (seven) days. Monday   Yes [provider]  cholestyramine ORMA) 4 G packet Take 4 g by mouth daily. Patient not taking: Reported on 08/09/2024    [provider]    History reviewed. No pertinent family history.   Social History   Tobacco Use   Smoking status: Never   Smokeless tobacco: Current    Types: Snuff  Substance Use Topics   Alcohol use: Yes    Comment: a cocktail daily   Drug use: No    Allergies as of 08/09/2024   (No Known Allergies)    Review of Systems:    All systems reviewed and  negative except where noted in HPI.   Physical Exam:  Vital signs in last 24 hours: Temp:  [97.8 F (36.6 C)-98.2 F (36.8 C)] 98.2 F (36.8 C) (09/22 1434) Pulse Rate:  [63-73] 63 (09/22 1400) Resp:  [12-18] 12 (09/22 1400) BP: (125-150)/(65-81) 137/81 (09/22 1400) SpO2:  [95 %-100 %] 96 % (09/22 1400) Weight:  [129.3 kg] 129.3 kg (09/22 1101)   General:   Pleasant, cooperative in NAD Head:  Normocephalic and atraumatic. Eyes:   No icterus.   Conjunctiva pink. PERRLA. Ears:  Normal auditory acuity. Neck:  Supple; no masses or thyroidomegaly Lungs: Respirations even and unlabored. Lungs clear to auscultation bilaterally.   No wheezes, crackles, or rhonchi.   Heart:  Regular rate and rhythm;  Without murmur, clicks, rubs or gallops Abdomen:  Soft, nondistended, mild epigastric tenderness. Normal bowel sounds. No appreciable masses or hepatomegaly.  No rebound or guarding.  Rectal:  Not performed. Msk:  Symmetrical without gross deformities.   Extremities:  Without edema, cyanosis or clubbing. Neurologic:  Alert and oriented x3;  grossly normal neurologically. Skin:  Intact without significant lesions or rashes. Cervical Nodes:  No significant cervical adenopathy. Psych:  Alert and cooperative. Normal affect.  LAB RESULTS: Recent Labs    08/09/24 0738  WBC 10.1  HGB 18.6*  HCT 52.9*  PLT 292   BMET Recent Labs    08/09/24 0738  NA 131*  K 3.2*  CL 89*  CO2 26  GLUCOSE 113*  BUN 8  CREATININE 0.56*  CALCIUM 8.9   LFT Recent Labs    08/09/24 0738  PROT 6.7  ALBUMIN 3.3*  AST 19  ALT 12  ALKPHOS 63  BILITOT 1.1  BILIDIR 0.3*  IBILI 0.8   PT/INR Recent Labs    08/09/24 1125  LABPROT 13.2  INR 1.0    STUDIES: CT ABDOMEN PELVIS W CONTRAST Result Date: 08/09/2024 CLINICAL DATA:  RUQ pain, hx cholecystectomy EXAM: CT ABDOMEN AND PELVIS WITH CONTRAST TECHNIQUE: Multidetector CT imaging of the abdomen and pelvis was performed using the standard protocol following bolus administration of intravenous contrast. RADIATION DOSE REDUCTION: This exam was performed according to the departmental dose-optimization program which includes automated exposure control, adjustment of the mA and/or kV according to patient size and/or use of iterative reconstruction technique. CONTRAST:  OMNIPAQUE  IOHEXOL  350 MG/ML SOLN COMPARISON:  None available. FINDINGS: Lower chest: For findings above the diaphragm, please see the separately dictated CT of the chest report, which was performed concurrently. Hepatobiliary: No mass.Cholecystectomy no intrahepatic or extrahepatic biliary ductal dilation. The portal veins are patent. Pancreas: No mass  or main ductal dilation. No peripancreatic inflammation or fluid collection. Spleen: Normal size. No mass. Adrenals/Urinary Tract: No adrenal masses. 2 cm cyst in the left kidney. No nephrolithiasis or hydronephrosis. The urinary bladder is distended without focal abnormality. Stomach/Bowel: The stomach contains ingested material without focal abnormality. Moderate wall thickening and inflammation surrounding the first and second portions of the duodenum. 3.1 cm outpouching along the first portion of the duodenum (axial 30). There is a second small outpouching along the posterior aspect of the second portion of the duodenum measuring 1.1 cm (sagittal 74).No small bowel obstruction.Normal appendix. Descending and sigmoid colonic diverticulosis. No changes of acute diverticulitis. Vascular/Lymphatic: No aortic aneurysm. No intraabdominal or pelvic lymphadenopathy. Reproductive: No prostatomegaly.No free pelvic fluid. Other: No pneumoperitoneum or ascites. Musculoskeletal: No acute fracture or destructive lesion. Multilevel degenerative disc disease of the spine. IMPRESSION: Moderate wall thickening inflammation surrounding the first and  second portions of the duodenum. A couple of outpouchings noted medially in the first and second portions, measuring up to 3.1 cm (axial 30). Differential considerations include a couple of duodenum ulcers, duodenum diverticulitis, or even duodenitis with contained perforation. No pneumoperitoneum. Surgical and GI consultation recommended. Critical Value/emergent results were called by telephone at the time of interpretation on 08/09/2024 at 10:46 am to provider Brian DADE MD , who verbally acknowledged these results. Electronically Signed   By: Brian [name] M.D.   On: 08/09/2024 11:05   CT Angio Chest PE W and/or Wo Contrast Result Date: 08/09/2024 CLINICAL DATA:  Pulmonary embolism (PE) suspected, high prob EXAM: CT ANGIOGRAPHY CHEST WITH CONTRAST TECHNIQUE: Multidetector CT  imaging of the chest was performed using the standard protocol during bolus administration of intravenous contrast. Multiplanar CT image reconstructions and MIPs were obtained to evaluate the vascular anatomy. RADIATION DOSE REDUCTION: This exam was performed according to the departmental dose-optimization program which includes automated exposure control, adjustment of the mA and/or kV according to patient size and/or use of iterative reconstruction technique. CONTRAST:  OMNIPAQUE  IOHEXOL  350 MG/ML SOLN COMPARISON:  08/09/2024 FINDINGS: Pulmonary Embolism: Distal segmental/subsegmental embolus within the right lower lobe. There is also a proximal segmental pulmonary embolus within the right middle lobe. No findings of right heart strain. Cardiovascular: No cardiomegaly or pericardial effusion.No aortic aneurysm. Mediastinum/Nodes: No mediastinal mass.No mediastinal, hilar, or axillary lymphadenopathy. Lungs/Pleura: The midline trachea and bronchi are patent. Posterior bibasilar dependent atelectasis. No focal airspace consolidation, pleural effusion, or pneumothorax. Musculoskeletal: No acute fracture or destructive bone lesion. Multilevel degenerative disc disease of the spine. Upper Abdomen: For findings below the diaphragm, please see the separately dictated CT of the abdomen and pelvis report, which was performed concurrently. Review of the MIP images confirms the above findings. IMPRESSION: 1. Small burden pulmonary emboli in the right middle and right lower lobes, as delineated above. No findings of right heart strain. 2. No pneumonia, pulmonary edema, or pleural effusion. 3. For findings below the diaphragm, please see the separately dictated CT of the abdomen and pelvis report, which was performed concurrently. Critical Value/emergent results were called by telephone at the time of interpretation on 08/09/2024 at 10:46 am to provider Brian DADE MD , who verbally acknowledged these results. Electronically  Signed   By: Brian [name] M.D.   On: 08/09/2024 10:51   DG Chest Port 1 View Result Date: 08/09/2024 EXAM: 1 VIEW XRAY OF THE CHEST 08/09/2024 07:29:00 AM COMPARISON: 09/29/2006 CLINICAL HISTORY: Pt reports having upper abd pain and right sided CP that has been intermittent all weekend. Pt reports hx of esophageal surgery and gallbladder removal. Denies cardiac hx. Pain 8/10. FINDINGS: LUNGS AND PLEURA: No focal pulmonary opacity. No pulmonary edema. No pleural effusion. No pneumothorax. HEART AND MEDIASTINUM: No acute abnormality of the cardiac and mediastinal silhouettes. BONES AND SOFT TISSUES: No acute osseous abnormality. IMPRESSION: 1. Normal chest radiograph. Electronically signed by: Brian Calk MD 08/09/2024 07:37 AM EDT RP Workstation: [id number]      Impression / Plan:   Assessment: Principal Problem:   Pulmonary emboli (HCC) Active Problems:   Acute pulmonary embolism (HCC)   Abdominal pain   Brian [name] is a 63 y.o. y/o male with findings of a PE after being admitted with chest pain.  Incidentally the patient was found to have moderate wall thickening surrounding the 1st and 2nd portion duodenum with some areas that look like diverticulum versus a duodenal ulcer versus a duodenal diverticulitis versus contained  perforation.  The patient's abdomen is soft and does not appear to have peritoneal signs.  Plan:  The patient has been started on heparin  for his PEs and with the above findings and his heparin  drip I do not believe that pursuing a upper endoscopy at this time would be prudent.  I have discussed with the ER doctor about not doing any GI intervention at this time and he should be treated for possible peptic ulcer disease with a PPI.  If the patient should show any sign of bleeding since he did report that he has had black stools in the past, then the heparin  may need to be held and an upper endoscopy done at that time.  Otherwise we will follow along with you and await  surgical recommendations.  Thank you for involving me in the care of this patient.      LOS: 0 days   Brian Copping, MD, MD. NOLIA 08/09/2024, 3:59 PM,  Pager [phone] 7am-5pm  Check AMION for 5pm -7am coverage and on weekends   Note: This dictation was prepared with Dragon dictation along with smaller phrase technology. Any transcriptional errors that result from this process are unintentional.

## 2024-08-09 NOTE — ED Provider Notes (Signed)
 [facility] Provider Note    Event Date/Time   First MD Initiated Contact with Patient 08/09/24 [phone]     (approximate)   History   Chest Pain   HPI  Brian [name] is a 63 year old male with history of hypertension, cholecystectomy presenting to the emergency department for evaluation of right-sided pain.  Patient reports that for several weeks he has had intermittent episodes of epigastric and right sided discomfort.  He saw his primary care doctor who placed a referral to GI but this has not yet been set up.  He does report a history of a distant cholecystectomy as well as an esophageal surgery that he thought may have been related to reflux.  Denies known cardiac history.  Today, symptoms similar to prior but more severe leading him to present to the ER.  Reviewed primary care visit from 7/18.  He has had ongoing issues with constipation for which he is on a bowel regimen from his primary care doctor, does report improvement with Linzess  for this.  Had a CT abdomen pelvis performed on 7/28 without acute findings.     Physical Exam   Triage Vital Signs: ED Triage Vitals  Encounter Vitals Group     BP 08/09/24 0705 (!) 150/79     Girls Systolic BP Percentile --      Girls Diastolic BP Percentile --      Boys Systolic BP Percentile --      Boys Diastolic BP Percentile --      Pulse Rate 08/09/24 0705 73     Resp 08/09/24 0705 18     Temp 08/09/24 0705 98.2 F (36.8 C)     Temp Source 08/09/24 0705 Oral     SpO2 08/09/24 0705 97 %     Weight --      Height --      Head Circumference --      Peak Flow --      Pain Score 08/09/24 0704 8     Pain Loc --      Pain Education --      Exclude from Growth Chart --     Most recent vital signs: Vitals:   08/09/24 1130 08/09/24 1230  BP: 133/65 (!) 140/71  Pulse: 65 69  Resp: 13 14  Temp:    SpO2: 95% 99%     General: Awake, interactive  CV:  Regular rate, good peripheral perfusion.   Resp:  Unlabored respirations, clear to auscultation Chest wall: Tenderness over the right anterior chest wall Abd:  Nondistended, soft, tenderness to palpation in the epigastric region into the right upper quadrant, remainder of abdomen nontender Neuro:  Symmetric facial movement, fluid speech   ED Results / Procedures / Treatments   Labs (all labs ordered are listed, but only abnormal results are displayed) Labs Reviewed  BASIC METABOLIC PANEL WITH GFR - Abnormal; Notable for the following components:      Result Value   Sodium 131 (*)    Potassium 3.2 (*)    Chloride 89 (*)    Glucose, Bld 113 (*)    Creatinine, Ser 0.56 (*)    Anion gap 16 (*)    All other components within normal limits  CBC - Abnormal; Notable for the following components:   Hemoglobin 18.6 (*)    HCT 52.9 (*)    All other components within normal limits  HEPATIC FUNCTION PANEL - Abnormal; Notable for the following components:   Albumin 3.3 (*)  Bilirubin, Direct 0.3 (*)    All other components within normal limits  LIPASE, BLOOD - Abnormal; Notable for the following components:   Lipase 61 (*)    All other components within normal limits  D-DIMER, QUANTITATIVE - Abnormal; Notable for the following components:   D-Dimer, Quant 1.25 (*)    All other components within normal limits  PROTIME-INR  APTT  HEPARIN  LEVEL (UNFRACTIONATED)  TROPONIN I (HIGH SENSITIVITY)  TROPONIN I (HIGH SENSITIVITY)     EKG EKG independently reviewed and interpreted by myself demonstrates:  EKG demonstrates normal sinus rhythm at a rate of 75, PR 158, QRS 150, QTc 480, right bundle branch block noted, no STEMI  RADIOLOGY Imaging independently reviewed and interpreted by myself demonstrates:  CXR without focal consolidation CTA chest with small volume PE CT abdomen pelvis with duodenal inflammation  Formal Radiology Read:  CT ABDOMEN PELVIS W CONTRAST Result Date: 08/09/2024 CLINICAL DATA:  RUQ pain, hx  cholecystectomy EXAM: CT ABDOMEN AND PELVIS WITH CONTRAST TECHNIQUE: Multidetector CT imaging of the abdomen and pelvis was performed using the standard protocol following bolus administration of intravenous contrast. RADIATION DOSE REDUCTION: This exam was performed according to the departmental dose-optimization program which includes automated exposure control, adjustment of the mA and/or kV according to patient size and/or use of iterative reconstruction technique. CONTRAST:  OMNIPAQUE  IOHEXOL  350 MG/ML SOLN COMPARISON:  None available. FINDINGS: Lower chest: For findings above the diaphragm, please see the separately dictated CT of the chest report, which was performed concurrently. Hepatobiliary: No mass.Cholecystectomy no intrahepatic or extrahepatic biliary ductal dilation. The portal veins are patent. Pancreas: No mass or main ductal dilation. No peripancreatic inflammation or fluid collection. Spleen: Normal size. No mass. Adrenals/Urinary Tract: No adrenal masses. 2 cm cyst in the left kidney. No nephrolithiasis or hydronephrosis. The urinary bladder is distended without focal abnormality. Stomach/Bowel: The stomach contains ingested material without focal abnormality. Moderate wall thickening and inflammation surrounding the first and second portions of the duodenum. 3.1 cm outpouching along the first portion of the duodenum (axial 30). There is a second small outpouching along the posterior aspect of the second portion of the duodenum measuring 1.1 cm (sagittal 74).No small bowel obstruction.Normal appendix. Descending and sigmoid colonic diverticulosis. No changes of acute diverticulitis. Vascular/Lymphatic: No aortic aneurysm. No intraabdominal or pelvic lymphadenopathy. Reproductive: No prostatomegaly.No free pelvic fluid. Other: No pneumoperitoneum or ascites. Musculoskeletal: No acute fracture or destructive lesion. Multilevel degenerative disc disease of the spine. IMPRESSION: Moderate wall  thickening inflammation surrounding the first and second portions of the duodenum. A couple of outpouchings noted medially in the first and second portions, measuring up to 3.1 cm (axial 30). Differential considerations include a couple of duodenum ulcers, duodenum diverticulitis, or even duodenitis with contained perforation. No pneumoperitoneum. Surgical and GI consultation recommended. Critical Value/emergent results were called by telephone at the time of interpretation on 08/09/2024 at 10:46 am to provider NILSA DADE MD , who verbally acknowledged these results. Electronically Signed   By: Rogelia Myers M.D.   On: 08/09/2024 11:05   CT Angio Chest PE W and/or Wo Contrast Result Date: 08/09/2024 CLINICAL DATA:  Pulmonary embolism (PE) suspected, high prob EXAM: CT ANGIOGRAPHY CHEST WITH CONTRAST TECHNIQUE: Multidetector CT imaging of the chest was performed using the standard protocol during bolus administration of intravenous contrast. Multiplanar CT image reconstructions and MIPs were obtained to evaluate the vascular anatomy. RADIATION DOSE REDUCTION: This exam was performed according to the departmental dose-optimization program which includes automated exposure control,  adjustment of the mA and/or kV according to patient size and/or use of iterative reconstruction technique. CONTRAST:  OMNIPAQUE  IOHEXOL  350 MG/ML SOLN COMPARISON:  08/09/2024 FINDINGS: Pulmonary Embolism: Distal segmental/subsegmental embolus within the right lower lobe. There is also a proximal segmental pulmonary embolus within the right middle lobe. No findings of right heart strain. Cardiovascular: No cardiomegaly or pericardial effusion.No aortic aneurysm. Mediastinum/Nodes: No mediastinal mass.No mediastinal, hilar, or axillary lymphadenopathy. Lungs/Pleura: The midline trachea and bronchi are patent. Posterior bibasilar dependent atelectasis. No focal airspace consolidation, pleural effusion, or pneumothorax. Musculoskeletal:  No acute fracture or destructive bone lesion. Multilevel degenerative disc disease of the spine. Upper Abdomen: For findings below the diaphragm, please see the separately dictated CT of the abdomen and pelvis report, which was performed concurrently. Review of the MIP images confirms the above findings. IMPRESSION: 1. Small burden pulmonary emboli in the right middle and right lower lobes, as delineated above. No findings of right heart strain. 2. No pneumonia, pulmonary edema, or pleural effusion. 3. For findings below the diaphragm, please see the separately dictated CT of the abdomen and pelvis report, which was performed concurrently. Critical Value/emergent results were called by telephone at the time of interpretation on 08/09/2024 at 10:46 am to provider NILSA DADE MD , who verbally acknowledged these results. Electronically Signed   By: Rogelia Myers M.D.   On: 08/09/2024 10:51   DG Chest Port 1 View Result Date: 08/09/2024 EXAM: 1 VIEW XRAY OF THE CHEST 08/09/2024 07:29:00 AM COMPARISON: 09/29/2006 CLINICAL HISTORY: Pt reports having upper abd pain and right sided CP that has been intermittent all weekend. Pt reports hx of esophageal surgery and gallbladder removal. Denies cardiac hx. Pain 8/10. FINDINGS: LUNGS AND PLEURA: No focal pulmonary opacity. No pulmonary edema. No pleural effusion. No pneumothorax. HEART AND MEDIASTINUM: No acute abnormality of the cardiac and mediastinal silhouettes. BONES AND SOFT TISSUES: No acute osseous abnormality. IMPRESSION: 1. Normal chest radiograph. Electronically signed by: Waddell Calk MD 08/09/2024 07:37 AM EDT RP Workstation: [id number]    PROCEDURES:  Critical Care performed: Yes, see critical care procedure note(s)  CRITICAL CARE Performed by: NILSA DADE   Total critical care time: 32 minutes  Critical care time was exclusive of separately billable procedures and treating other patients.  Critical care was necessary to treat or prevent imminent or  life-threatening deterioration.  Critical care was time spent personally by me on the following activities: development of treatment plan with patient and/or surrogate as well as nursing, discussions with consultants, evaluation of patient's response to treatment, examination of patient, obtaining history from patient or surrogate, ordering and performing treatments and interventions, ordering and review of laboratory studies, ordering and review of radiographic studies, pulse oximetry and re-evaluation of patient's condition.   Procedures   MEDICATIONS ORDERED IN ED: Medications  heparin  ADULT infusion 100 units/mL (25000 units/250mL) (1,750 Units/hr Intravenous New Bag/Given 08/09/24 1158)  morphine  (PF) 4 MG/ML injection 4 mg (4 mg Intravenous Given 08/09/24 0757)  iohexol  (OMNIPAQUE ) 350 MG/ML injection 100 mL (100 mLs Intravenous Contrast Given 08/09/24 1005)  pantoprazole  (PROTONIX ) injection 40 mg (40 mg Intravenous Given 08/09/24 1103)  heparin  bolus via infusion 6,500 Units (6,500 Units Intravenous Bolus from Bag 08/09/24 1158)     IMPRESSION / MDM / ASSESSMENT AND PLAN / ED COURSE  I reviewed the triage vital signs and the nursing notes.  Differential diagnosis includes, but is not limited to, ACS, pneumonia, pulmonary embolism, choledocholithiasis, GERD, pancreatitis, consideration but lower suspicion for other acute intra-abdominal  process  Patient's presentation is most consistent with acute presentation with potential threat to life or bodily function.  64 year old male presenting with worsened right chest and abdominal pain.  Stable vitals on presentation.  Will obtain labs, EKG, x-Rehema Muffley to further evaluate.  If D-dimer is positive, will plan for CT of the chest and likely CT abdomen pelvis as well for further relation.  Order for morphine  for pain control.  Clinical Course as of 08/09/24 1327  Mon Aug 09, 2024  0801 D-dimer, quantitative(!) D-dimer positive, CTA of the chest  ordered.  With abdominal pain, will also obtain CT abdomen pelvis [NR]  1050 Received call from radiology.  CTA of the chest demonstrated multiple small right-sided pulmonary emboli.  CT abdomen pelvis demonstrated inflammation around the duodenum which could be reflective of duodenitis, duodenal diverticulitis though patient did not have visible diverticuli on prior imaging, or possible contained perforation.  No visible free air.    Regarding PE, will order heparin , normal troponin and no reported right heart strain on imaging so do not think there is an indication for thrombectomy currently.  Regarding duodenal abnormality will discuss with general surgery. [NR]  1057 Oral potassium not yet given, will cancel this as I suspect this may cause further inflammation of patient's gastric/duodenal region. Will order protonix .  [NR]  1125 Discussed with student with general surgery team. Dr. Tye is in the OR, but she will notify PA Solis. I will also send a secure chat to PA Solis. [NR]  1223 Case discussed with PA Solis who evaluated the patient at bedside.  Dr. Tye has not yet reviewed the patient's images as he is in the OR, but no current plans for operative intervention.  Does recommend discussion with GI, agrees with medicine admission. [NR]  1312 Case discussed with Dr. Jinny.  He agrees with PPI.  He will evaluate the patient.  He reports that duodenitis is not typically infectious, would not recommend antibiotics for just duodenal inflammation. Will reach out to hospitalist team to discuss admission.  [NR]  1326 Case discussed with Dr. Laurita.  He will evaluate for anticipated admission. [NR]    Clinical Course User Index [NR] Levander Slate, MD     FINAL CLINICAL IMPRESSION(S) / ED DIAGNOSES   Final diagnoses:  Multiple subsegmental pulmonary emboli without acute cor pulmonale (HCC)  Duodenitis     Rx / DC Orders   ED Discharge Orders     None        Note:  This document was  prepared using Dragon voice recognition software and may include unintentional dictation errors.   Levander Slate, MD 08/09/24 [phone]

## 2024-08-09 NOTE — Progress Notes (Signed)
 PHARMACY - ANTICOAGULATION CONSULT NOTE  Pharmacy Consult for Heparin  Infusion Indication: pulmonary embolus  No Known Allergies  Patient Measurements: Height: 6' 2 (188 cm) Weight: 129.3 kg (285 lb) IBW/kg (Calculated) : 82.2 HEPARIN  DW (KG): 110.7  Vital Signs: Temp: 97.8 F (36.6 C) (09/22 1028) Temp Source: Oral (09/22 1028) BP: 140/75 (09/22 1028) Pulse Rate: 63 (09/22 1028)  Labs: Recent Labs    08/09/24 0738  HGB 18.6*  HCT 52.9*  PLT 292  CREATININE 0.56*  TROPONINIHS 5    Estimated Creatinine Clearance: 135 mL/min (A) (by C-G formula based on SCr of 0.56 mg/dL (L)).   Medical History: Past Medical History:  Diagnosis Date   Hypertension    Kidney stones     Medications:  Not on anticoagulation at home per chart review  Assessment: Patient is a 63 year old male with a past medical history of hypertension, and cholecystectomy who presented to the ED with right-sided pain. Patient has elevated D-dimer of 1.25 and CT chest shows small burden pulmonary emboli in the right middle and right lower lobes. Pharmacy was consulted to initiate patient on a heparin  infusion.  Baseline INR and aPTT ordered. Hgb 18.6. PLT 292. No signs/symptoms of bleeding noted in chart.   Goal of Therapy:  Heparin  level 0.3-0.7 units/ml Monitor platelets by anticoagulation protocol: Yes   Plan:  Give 6500 unit bolus x 1 Start heparin  infusion at a rate of 1750 units/hr Check heparin  level in 6 hours after start of infusion Monitor CBC daily while on heparin  Monitor for appropriate transition to oral anticoagulation   Lum VEAR Mania, PharmD, BCPS 08/09/2024,11:12 AM

## 2024-08-09 NOTE — Consult Note (Signed)
 [facility]  SURGICAL CONSULTATION NOTE    HISTORY OF PRESENT ILLNESS (HPI):  63 y.o. male presented to [facility] ED today for evaluation of intermittent right sided pain.  States that the pain has been going on for several months now.  States that the pain had intensified this morning. Pain has mainly been epigastric but radiates to right chest.  No known alleviating or aggravating factors. States he saw his primary care and placed in a referral to GI. Appointment had not been scheduled.  Patient has a history of constipation and has been controlled with Linzess . He has a abdominal history of cholecystectomy and esophageal surgery.   In the ED, patient was hypertensive with a BP of 150/79.  Otherwise vitals were stable.  No leukocytosis indicated on labs.  Direct bilirubin slightly elevated at 0.3.  LFTs and alkaline phos were within normal range.  Lipase was slightly elevated at 61. CTA of chest showed small PE in the right middle and right lower lobes. CT of abdomen showed moderate wall thickening of 1st and 2nd portions of duodenum with a couple outpouching measuring up to 3.6 cm.  Differential includes duodenal ulcers, duodenal diverticulitis, or duodenitis with contained perforation.  No free air noted.   Surgery is consulted by Dr. Melanee in this context for evaluation and management of likely duodenitis.  PAST MEDICAL HISTORY (PMH):  Past Medical History:  Diagnosis Date   Hypertension    Kidney stones      PAST SURGICAL HISTORY (PSH):  Past Surgical History:  Procedure Laterality Date   CHOLECYSTECTOMY     Reflux sugery     TONSILLECTOMY       MEDICATIONS:  Prior to Admission medications   Medication Sig Start Date End Date Taking? Authorizing Provider  cholestyramine (QUESTRAN) 4 G packet Take 4 g by mouth daily.    [provider]  ibuprofen  (ADVIL ,MOTRIN ) 600 MG tablet Take 1 tablet (600 mg total) by mouth every 6 (six) hours as needed for moderate  pain. 10/08/17   Siadecki, Sebastian, MD  lisinopril (PRINIVIL,ZESTRIL) 40 MG tablet Take 40 mg by mouth daily.    [provider]  oseltamivir  (TAMIFLU ) 75 MG capsule Take 1 capsule (75 mg total) by mouth every 12 (twelve) hours. 11/27/22   Immordino, Garnette, FNP  PARoxetine  (PAXIL ) 40 MG tablet Take 40 mg by mouth every morning.    [provider]  tamsulosin  (FLOMAX ) 0.4 MG CAPS capsule Take 1 capsule (0.4 mg total) by mouth daily after supper. 10/08/17   Jacolyn Pae, MD     ALLERGIES:  No Known Allergies   SOCIAL HISTORY:  Social History   Socioeconomic History   Marital status: Married    Spouse name: Not on file   Number of children: Not on file   Years of education: Not on file   Highest education level: Not on file  Occupational History   Not on file  Tobacco Use   Smoking status: Never   Smokeless tobacco: Current    Types: Snuff  Substance and Sexual Activity   Alcohol use: Yes    Comment: a cocktail daily   Drug use: No   Sexual activity: Not on file  Other Topics Concern   Not on file  Social History Narrative   Not on file   Social Drivers of Health   Financial Resource Strain: Low Risk  (02/28/2023)   Received from [facility]   Overall Financial Resource Strain (CARDIA)  Difficulty of Paying Living Expenses: Not hard at all  Food Insecurity: No Food Insecurity (02/28/2023)   Received from [facility]   Hunger Vital Sign    Within the past 12 months, you worried that your food would run out before you got the money to buy more.: Never true    Within the past 12 months, the food you bought just didn't last and you didn't have money to get more.: Never true  Transportation Needs: No Transportation Needs (02/28/2023)   Received from [facility] - Transportation    In the past 12 months, has lack of transportation kept you from medical appointments or from getting  medications?: No    Lack of Transportation (Non-Medical): No  Physical Activity: Not on file  Stress: Not on file  Social Connections: Not on file  Intimate Partner Violence: Not on file     FAMILY HISTORY:  History reviewed. No pertinent family history.    REVIEW OF SYSTEMS:  Review of Systems  Constitutional:  Negative for chills and fever.  Respiratory:  Negative for cough and wheezing.   Cardiovascular:  Positive for chest pain. Negative for palpitations.  Gastrointestinal:  Positive for abdominal pain and constipation. Negative for nausea and vomiting.    VITAL SIGNS:  Temp:  [97.8 F (36.6 C)-98.2 F (36.8 C)] 97.8 F (36.6 C) (09/22 1028) Pulse Rate:  [63-73] 63 (09/22 1028) Resp:  [12-18] 12 (09/22 1028) BP: (140-150)/(75-79) 140/75 (09/22 1028) SpO2:  [97 %-100 %] 100 % (09/22 1028) Weight:  [129.3 kg] 129.3 kg (09/22 1101)     Height: 6' 2 (188 cm) Weight: 129.3 kg BMI (Calculated): 36.58   INTAKE/OUTPUT:  No intake/output data recorded.  PHYSICAL EXAM:  Physical Exam Constitutional:      Appearance: He is well-developed.  HENT:     Head: Normocephalic and atraumatic.  Cardiovascular:     Rate and Rhythm: Normal rate and regular rhythm.  Pulmonary:     Effort: Pulmonary effort is normal.     Breath sounds: Normal breath sounds.  Abdominal:     Palpations: Abdomen is soft.     Tenderness: There is abdominal tenderness (epigastric). There is no guarding.  Neurological:     Mental Status: He is alert.     Labs:     Latest Ref Rng & Units 08/09/2024    7:38 AM 10/08/2017    4:00 PM 10/27/2015   12:06 AM  CBC  WBC 4.0 - 10.5 K/uL 10.1  9.7  6.4   Hemoglobin 13.0 - 17.0 g/dL 81.3  84.3  85.1   Hematocrit 39.0 - 52.0 % 52.9  45.2  44.3   Platelets 150 - 400 K/uL 292  271  222       Latest Ref Rng & Units 08/09/2024    7:38 AM 04/30/2022    3:36 PM 10/08/2017    4:00 PM  CMP  Glucose 70 - 99 mg/dL 886   890   BUN 8 - 23 mg/dL 8   14   Creatinine  9.38 - 1.24 mg/dL 9.43  8.89  9.14   Sodium 135 - 145 mmol/L 131   137   Potassium 3.5 - 5.1 mmol/L 3.2   3.9   Chloride 98 - 111 mmol/L 89   102   CO2 22 - 32 mmol/L 26   21   Calcium 8.9 - 10.3 mg/dL 8.9   8.9   Total Protein 6.5 - 8.1  g/dL 6.7     Total Bilirubin 0.0 - 1.2 mg/dL 1.1     Alkaline Phos 38 - 126 U/L 63     AST 15 - 41 U/L 19     ALT 0 - 44 U/L 12       Imaging studies:  CLINICAL DATA:  RUQ pain, hx cholecystectomy   EXAM: CT ABDOMEN AND PELVIS WITH CONTRAST   TECHNIQUE: Multidetector CT imaging of the abdomen and pelvis was performed using the standard protocol following bolus administration of intravenous contrast.   RADIATION DOSE REDUCTION: This exam was performed according to the departmental dose-optimization program which includes automated exposure control, adjustment of the mA and/or kV according to patient size and/or use of iterative reconstruction technique.   CONTRAST:  OMNIPAQUE  IOHEXOL  350 MG/ML SOLN   COMPARISON:  None available.   FINDINGS: Lower chest: For findings above the diaphragm, please see the separately dictated CT of the chest report, which was performed concurrently.   Hepatobiliary: No mass.Cholecystectomy no intrahepatic or extrahepatic biliary ductal dilation. The portal veins are patent.   Pancreas: No mass or main ductal dilation. No peripancreatic inflammation or fluid collection.   Spleen: Normal size. No mass.   Adrenals/Urinary Tract: No adrenal masses. 2 cm cyst in the left kidney. No nephrolithiasis or hydronephrosis. The urinary bladder is distended without focal abnormality.   Stomach/Bowel: The stomach contains ingested material without focal abnormality. Moderate wall thickening and inflammation surrounding the first and second portions of the duodenum. 3.1 cm outpouching along the first portion of the duodenum (axial 30). There is a second small outpouching along the posterior aspect of the  second portion of the duodenum measuring 1.1 cm (sagittal 74).No small bowel obstruction.Normal appendix. Descending and sigmoid colonic diverticulosis. No changes of acute diverticulitis.   Vascular/Lymphatic: No aortic aneurysm. No intraabdominal or pelvic lymphadenopathy.   Reproductive: No prostatomegaly.No free pelvic fluid.   Other: No pneumoperitoneum or ascites.   Musculoskeletal: No acute fracture or destructive lesion. Multilevel degenerative disc disease of the spine.   IMPRESSION: Moderate wall thickening inflammation surrounding the first and second portions of the duodenum. A couple of outpouchings noted medially in the first and second portions, measuring up to 3.1 cm (axial 30). Differential considerations include a couple of duodenum ulcers, duodenum diverticulitis, or even duodenitis with contained perforation. No pneumoperitoneum. Surgical and GI consultation recommended.   Critical Value/emergent results were called by telephone at the time of interpretation on 08/09/2024 at 10:46 am to provider NILSA DADE MD , who verbally acknowledged these results.     Electronically Signed   By: Rogelia Myers M.D.   On: 08/09/2024 11:05   Assessment/Plan: 63 y.o. male with likely duodenitis, complicated by pertinent comorbidities including hypertension and history of kidney stones.    - Overall patient appears stable, no acute abdomen on exam.  - Discussed plan with Dr. Tye. No surgical intervention required at this time. Recommend to management conservatively with IV fluids. Will recommend NPO for the time being.   - GI is recommending to treat for peptic ulcer disease with PPI. They are not recommending upper endoscopy at this time since he is on heparin  for PEs and no signs of active bleeding.   - Continue to follow GI and hospitalist recommendations.   Thank you for the opportunity to participate in this patient's care.   -- Gilmer Cea PA-C

## 2024-08-09 NOTE — Telephone Encounter (Signed)
 Patient Product/process development scientist completed.    The patient is insured through [facility]. Patient has [employer], may use a copay card, and/or apply for patient assistance if available.    Ran test claim for Eliquis  5 mg and the current 30 day co-pay is $25.00.  Ran test claim for Xarelto 20 mg and the current 30 day co-pay is $25.00.  This test claim was processed through [facility]- copay amounts may vary at other pharmacies due to pharmacy/plan contracts, or as the patient moves through the different stages of their insurance plan.     Reyes Sharps, CPHT Pharmacy Technician III Certified Patient Advocate [facility] Patient Advocate Team Direct Number: [phone]  Fax: [phone]

## 2024-08-09 NOTE — H&P (Signed)
 History and Physical    Brian [name] FMW:[id number] DOB: 27-Oct-1961 DOA: 08/09/2024  PCP: Sadie Manna, MD (Confirm with patient/family/NH records and if not entered, this has to be entered at [facility] point of entry) Patient coming from: Home  I have personally briefly reviewed patient's old medical records in [facility]  Chief Complaint: Worsening of abdominal pain, new onset chest pain  HPI: Brian [name] is a 63 y.o. male with medical history significant of HTN, alcohol abuse, morbid obesity, presented with worsening abdominal pain and concern for chest pain.  Symptoms started about 1 month ago, patient started to have intermittent epigastric abdominal pain, sharp-like, nonradiating, usually happens in early mornings, abdominal pain can be relieved by taking early breakfast and sometimes ibuprofen  and patient has been taking ibuprofen  almost every day for the last 2 to 3 weeks in the morning.  He drinks 1 cocktail glass of liquor every night.  Last 3 days, he started develop sharp like right-sided chest pain, intermittent, worsening with cough.  Denies any fever or chills. ED Course: Afebrile, no tachycardia blood pressure 150/70 O2 saturation 100% on room air.  Blood work showed WBC 10.1 hemoglobin 18.6 BUN 8 creatinine 0.5 sodium 135 potassium 3.2.  CT abdominal pelvis that showed inflammation of duodenum versus duodenum diverticulitis versus contained perforation of duodenum.  Film was reviewed by surgeon and GI, both conclusion there is left IV perforation, likely more representative duodenum ulcer secondary to NSAIDs and alcohol use.  CTA chest showed possible right middle and lower lobe subsegmental PE, low blood clot burden, no signs of right heart strain.  Troponin negative x 2.  Review of Systems: As per HPI otherwise 14 point review of systems negative.    Past Medical History:  Diagnosis Date   Hypertension    Kidney stones     Past Surgical History:  Procedure  Laterality Date   CHOLECYSTECTOMY     Reflux sugery     TONSILLECTOMY       reports that he has never smoked. His smokeless tobacco use includes snuff. He reports current alcohol use. He reports that he does not use drugs.  No Known Allergies  History reviewed. No pertinent family history.  Prior to Admission medications   Medication Sig Start Date End Date Taking? Authorizing Provider  escitalopram  (LEXAPRO ) 10 MG tablet Take 10 mg by mouth daily.   Yes [provider]  hydrochlorothiazide (HYDRODIURIL) 12.5 MG tablet Take 12.5 mg by mouth daily.   Yes [provider]  linaclotide  (LINZESS ) 145 MCG CAPS capsule Take 145 mcg by mouth daily as needed. 07/12/24  Yes [provider]  lisinopril (PRINIVIL,ZESTRIL) 40 MG tablet Take 40 mg by mouth daily.   Yes [provider]  PARoxetine  (PAXIL ) 40 MG tablet Take 40 mg by mouth every morning.   Yes [provider]  Vitamin D , Ergocalciferol , (DRISDOL) 1.25 MG (50000 UNIT) CAPS capsule Take 50,000 Units by mouth every 7 (seven) days. Monday   Yes [provider]  cholestyramine ORMA) 4 G packet Take 4 g by mouth daily. Patient not taking: Reported on 08/09/2024    [provider]    Physical Exam: Vitals:   08/09/24 1028 08/09/24 1101 08/09/24 1130 08/09/24 1230  BP: (!) 140/75  133/65 (!) 140/71  Pulse: 63  65 69  Resp: 12  13 14   Temp: 97.8 F (36.6 C)     TempSrc: Oral     SpO2: 100%  95% 99%  Weight:  129.3 kg    Height:  6' 2 (1.88 m)      Constitutional: NAD, calm, comfortable Vitals:   08/09/24 1028 08/09/24 1101 08/09/24 1130 08/09/24 1230  BP: (!) 140/75  133/65 (!) 140/71  Pulse: 63  65 69  Resp: 12  13 14   Temp: 97.8 F (36.6 C)     TempSrc: Oral     SpO2: 100%  95% 99%  Weight:  129.3 kg    Height:  6' 2 (1.88 m)     Eyes: PERRL, lids and conjunctivae normal ENMT: Mucous membranes are moist. Posterior pharynx clear of any exudate or  lesions.Normal dentition.  Neck: normal, supple, no masses, no thyromegaly Respiratory: clear to auscultation bilaterally, no wheezing, no crackles. Normal respiratory effort. No accessory muscle use.  Cardiovascular: Regular rate and rhythm, no murmurs / rubs / gallops. No extremity edema. 2+ pedal pulses. No carotid bruits.  Abdomen: Mild tenderness on epigastric area, no rebound no guarding, no masses palpated. No hepatosplenomegaly. Bowel sounds positive.  Musculoskeletal: no clubbing / cyanosis. No joint deformity upper and lower extremities. Good ROM, no contractures. Normal muscle tone.  Skin: no rashes, lesions, ulcers. No induration Neurologic: CN 2-12 grossly intact. Sensation intact, DTR normal. Strength 5/5 in all 4.  Psychiatric: Normal judgment and insight. Alert and oriented x 3. Normal mood.    Labs on Admission: I have personally reviewed following labs and imaging studies  CBC: Recent Labs  Lab 08/09/24 0738  WBC 10.1  HGB 18.6*  HCT 52.9*  MCV 91.7  PLT 292   Basic Metabolic Panel: Recent Labs  Lab 08/09/24 0738  NA 131*  K 3.2*  CL 89*  CO2 26  GLUCOSE 113*  BUN 8  CREATININE 0.56*  CALCIUM 8.9   GFR: Estimated Creatinine Clearance: 135 mL/min (A) (by C-G formula based on SCr of 0.56 mg/dL (L)). Liver Function Tests: Recent Labs  Lab 08/09/24 0738  AST 19  ALT 12  ALKPHOS 63  BILITOT 1.1  PROT 6.7  ALBUMIN 3.3*   Recent Labs  Lab 08/09/24 0738  LIPASE 61*   No results for input(s): AMMONIA in the last 168 hours. Coagulation Profile: Recent Labs  Lab 08/09/24 1125  INR 1.0   Cardiac Enzymes: No results for input(s): CKTOTAL, CKMB, CKMBINDEX, TROPONINI in the last 168 hours. BNP (last 3 results) No results for input(s): PROBNP in the last 8760 hours. HbA1C: No results for input(s): HGBA1C in the last 72 hours. CBG: No results for input(s): GLUCAP in the last 168 hours. Lipid Profile: No results for input(s):  CHOL, HDL, LDLCALC, TRIG, CHOLHDL, LDLDIRECT in the last 72 hours. Thyroid Function Tests: No results for input(s): TSH, T4TOTAL, FREET4, T3FREE, THYROIDAB in the last 72 hours. Anemia Panel: No results for input(s): VITAMINB12, FOLATE, FERRITIN, TIBC, IRON, RETICCTPCT in the last 72 hours. Urine analysis:    Component Value Date/Time   COLORURINE YELLOW (A) 10/08/2017 1929   APPEARANCEUR CLEAR (A) 10/08/2017 1929   LABSPEC 1.025 10/08/2017 1929   PHURINE 6.0 10/08/2017 1929   GLUCOSEU NEGATIVE 10/08/2017 1929   HGBUR SMALL (A) 10/08/2017 1929   BILIRUBINUR NEGATIVE 10/08/2017 1929   KETONESUR 5 (A) 10/08/2017 1929   PROTEINUR 30 (A) 10/08/2017 1929   NITRITE NEGATIVE 10/08/2017 1929   LEUKOCYTESUR NEGATIVE 10/08/2017 1929    Radiological Exams on Admission: CT ABDOMEN PELVIS W CONTRAST Result Date: 08/09/2024 CLINICAL DATA:  RUQ pain, hx cholecystectomy EXAM: CT ABDOMEN AND PELVIS WITH CONTRAST TECHNIQUE: Multidetector CT imaging  of the abdomen and pelvis was performed using the standard protocol following bolus administration of intravenous contrast. RADIATION DOSE REDUCTION: This exam was performed according to the departmental dose-optimization program which includes automated exposure control, adjustment of the mA and/or kV according to patient size and/or use of iterative reconstruction technique. CONTRAST:  OMNIPAQUE  IOHEXOL  350 MG/ML SOLN COMPARISON:  None available. FINDINGS: Lower chest: For findings above the diaphragm, please see the separately dictated CT of the chest report, which was performed concurrently. Hepatobiliary: No mass.Cholecystectomy no intrahepatic or extrahepatic biliary ductal dilation. The portal veins are patent. Pancreas: No mass or main ductal dilation. No peripancreatic inflammation or fluid collection. Spleen: Normal size. No mass. Adrenals/Urinary Tract: No adrenal masses. 2 cm cyst in the left kidney. No  nephrolithiasis or hydronephrosis. The urinary bladder is distended without focal abnormality. Stomach/Bowel: The stomach contains ingested material without focal abnormality. Moderate wall thickening and inflammation surrounding the first and second portions of the duodenum. 3.1 cm outpouching along the first portion of the duodenum (axial 30). There is a second small outpouching along the posterior aspect of the second portion of the duodenum measuring 1.1 cm (sagittal 74).No small bowel obstruction.Normal appendix. Descending and sigmoid colonic diverticulosis. No changes of acute diverticulitis. Vascular/Lymphatic: No aortic aneurysm. No intraabdominal or pelvic lymphadenopathy. Reproductive: No prostatomegaly.No free pelvic fluid. Other: No pneumoperitoneum or ascites. Musculoskeletal: No acute fracture or destructive lesion. Multilevel degenerative disc disease of the spine. IMPRESSION: Moderate wall thickening inflammation surrounding the first and second portions of the duodenum. A couple of outpouchings noted medially in the first and second portions, measuring up to 3.1 cm (axial 30). Differential considerations include a couple of duodenum ulcers, duodenum diverticulitis, or even duodenitis with contained perforation. No pneumoperitoneum. Surgical and GI consultation recommended. Critical Value/emergent results were called by telephone at the time of interpretation on 08/09/2024 at 10:46 am to provider NILSA DADE MD , who verbally acknowledged these results. Electronically Signed   By: Rogelia Myers M.D.   On: 08/09/2024 11:05   CT Angio Chest PE W and/or Wo Contrast Result Date: 08/09/2024 CLINICAL DATA:  Pulmonary embolism (PE) suspected, high prob EXAM: CT ANGIOGRAPHY CHEST WITH CONTRAST TECHNIQUE: Multidetector CT imaging of the chest was performed using the standard protocol during bolus administration of intravenous contrast. Multiplanar CT image reconstructions and MIPs were obtained to evaluate  the vascular anatomy. RADIATION DOSE REDUCTION: This exam was performed according to the departmental dose-optimization program which includes automated exposure control, adjustment of the mA and/or kV according to patient size and/or use of iterative reconstruction technique. CONTRAST:  OMNIPAQUE  IOHEXOL  350 MG/ML SOLN COMPARISON:  08/09/2024 FINDINGS: Pulmonary Embolism: Distal segmental/subsegmental embolus within the right lower lobe. There is also a proximal segmental pulmonary embolus within the right middle lobe. No findings of right heart strain. Cardiovascular: No cardiomegaly or pericardial effusion.No aortic aneurysm. Mediastinum/Nodes: No mediastinal mass.No mediastinal, hilar, or axillary lymphadenopathy. Lungs/Pleura: The midline trachea and bronchi are patent. Posterior bibasilar dependent atelectasis. No focal airspace consolidation, pleural effusion, or pneumothorax. Musculoskeletal: No acute fracture or destructive bone lesion. Multilevel degenerative disc disease of the spine. Upper Abdomen: For findings below the diaphragm, please see the separately dictated CT of the abdomen and pelvis report, which was performed concurrently. Review of the MIP images confirms the above findings. IMPRESSION: 1. Small burden pulmonary emboli in the right middle and right lower lobes, as delineated above. No findings of right heart strain. 2. No pneumonia, pulmonary edema, or pleural effusion. 3. For findings  below the diaphragm, please see the separately dictated CT of the abdomen and pelvis report, which was performed concurrently. Critical Value/emergent results were called by telephone at the time of interpretation on 08/09/2024 at 10:46 am to provider NILSA DADE MD , who verbally acknowledged these results. Electronically Signed   By: Rogelia Myers M.D.   On: 08/09/2024 10:51   DG Chest Port 1 View Result Date: 08/09/2024 EXAM: 1 VIEW XRAY OF THE CHEST 08/09/2024 07:29:00 AM COMPARISON: 09/29/2006  CLINICAL HISTORY: Pt reports having upper abd pain and right sided CP that has been intermittent all weekend. Pt reports hx of esophageal surgery and gallbladder removal. Denies cardiac hx. Pain 8/10. FINDINGS: LUNGS AND PLEURA: No focal pulmonary opacity. No pulmonary edema. No pleural effusion. No pneumothorax. HEART AND MEDIASTINUM: No acute abnormality of the cardiac and mediastinal silhouettes. BONES AND SOFT TISSUES: No acute osseous abnormality. IMPRESSION: 1. Normal chest radiograph. Electronically signed by: Waddell Calk MD 08/09/2024 07:37 AM EDT RP Workstation: [id number]    EKG: Independently reviewed.  Sinus, RBBB, no significant acute ST-T changes.  Assessment/Plan Principal Problem:   Pulmonary emboli (HCC) Active Problems:   Acute pulmonary embolism (HCC)   Abdominal pain  (please populate well all problems here in Problem List. (For example, if patient is on BP meds at home and you resume or decide to hold them, it is a problem that needs to be her. Same for CAD, COPD, HLD and so on)  Acute right-sided PE, unprovoked - Continue heparin  drip, expect to change to Eliquis  on discharge - Blood clot burden low, no signs of right heart strain on CTA, will check echocardiogram and DVT study - Outpatient follow-up with hematology for hypercoagulable state workup.  Information given.  Acute epigastric pain Contained perforated duodenum ulcer suspected - Clinically suspect acute on ulcer given the fact that patient's symptoms involving empty stomach pain, happens in the mornings relieved by eating meals, on top of it, patient uses alcohol and NSAIDs almost every day for at least 3 to 4 weeks further increased concern of peptic ulcer. - Discussed case with surgeon Dr. Tye at bedside, who went through the imaging study, recommend no surgical intervention, continue PPI GI rest IV fluids. - ED physician discussed case with GI, Dr. Jinny recommended no antibiotics but only  PPI.  Hypokalemia - P.o. replacement - Check magnesium  level  Hyponatremia - Euvolemic, likely secondary to alcohol abuse - Fluid restriction recommended  Alcohol use -Currently there is no symptoms or signs of active withdrawal - CIWA protocol with as needed benzos  Morbid obesity - BMI= 36 - Calorie control recommended  DVT prophylaxis: Heparin  drip Code Status: Full code Family Communication: None at bedside Disposition Plan: Patient is sick with acute PE requiring parenteral anticoagulation as well as concurrent abdominal pain and imaging study showed evidence of contained duodenal perforation requiring close monitoring while on anticoagulation, expect more than 2 midnight hospital stay Consults called: General Surgeon Admission status:    Cort ONEIDA Mana MD [facility] Pager [phone]  08/09/2024, 2:10 PM

## 2024-08-10 DIAGNOSIS — R1013 Epigastric pain: Secondary | ICD-10-CM

## 2024-08-10 DIAGNOSIS — R001 Bradycardia, unspecified: Secondary | ICD-10-CM | POA: Diagnosis not present

## 2024-08-10 DIAGNOSIS — I2699 Other pulmonary embolism without acute cor pulmonale: Secondary | ICD-10-CM | POA: Diagnosis not present

## 2024-08-10 DIAGNOSIS — G8929 Other chronic pain: Secondary | ICD-10-CM

## 2024-08-10 LAB — BASIC METABOLIC PANEL WITH GFR
Anion gap: 10 (ref 5–15)
BUN: 10 mg/dL (ref 8–23)
CO2: 29 mmol/L (ref 22–32)
Calcium: 8.4 mg/dL — ABNORMAL LOW (ref 8.9–10.3)
Chloride: 94 mmol/L — ABNORMAL LOW (ref 98–111)
Creatinine, Ser: 0.51 mg/dL — ABNORMAL LOW (ref 0.61–1.24)
GFR, Estimated: >60 mL/min (ref >60)
Glucose, Bld: 88 mg/dL (ref 70–99)
Potassium: 3.2 mmol/L — ABNORMAL LOW (ref 3.5–5.1)
Sodium: 133 mmol/L — ABNORMAL LOW (ref 135–145)

## 2024-08-10 LAB — CBC
HCT: 51.5 % (ref 39.0–52.0)
Hemoglobin: 17.6 g/dL — ABNORMAL HIGH (ref 13.0–17.0)
MCH: 32.3 pg (ref 26.0–34.0)
MCHC: 34.2 g/dL (ref 30.0–36.0)
MCV: 94.5 fL (ref 80.0–100.0)
Platelets: 255 K/uL (ref 150–400)
RBC: 5.45 MIL/uL (ref 4.22–5.81)
RDW: 13.2 % (ref 11.5–15.5)
WBC: 7.8 K/uL (ref 4.0–10.5)
nRBC: 0 % (ref 0.0–0.2)

## 2024-08-10 LAB — HEPARIN LEVEL (UNFRACTIONATED)
Heparin Unfractionated: 0.12 [IU]/mL — ABNORMAL LOW (ref 0.30–0.70)
Heparin Unfractionated: 0.7 [IU]/mL (ref 0.30–0.70)
Heparin Unfractionated: 0.52 [IU]/mL (ref 0.30–0.70)

## 2024-08-10 LAB — TROPONIN I (HIGH SENSITIVITY)
Troponin I (High Sensitivity): 6 ng/L (ref <18)
Troponin I (High Sensitivity): 6 ng/L (ref <18)

## 2024-08-10 MED ORDER — OXYCODONE HCL 5 MG PO TABS
5.0000 mg | ORAL_TABLET | Freq: Four times a day (QID) | ORAL | Status: DC | PRN
  Administered 2024-08-10: 5 mg via ORAL
  Filled 2024-08-10: qty 1

## 2024-08-10 MED ORDER — HEPARIN BOLUS VIA INFUSION
3100.0000 [IU] | Freq: Once | INTRAVENOUS | Status: AC
  Administered 2024-08-10: 3100 [IU] via INTRAVENOUS
  Filled 2024-08-10: qty 3100

## 2024-08-10 MED ORDER — POTASSIUM CHLORIDE CRYS ER 20 MEQ PO TBCR
40.0000 meq | EXTENDED_RELEASE_TABLET | ORAL | Status: AC
  Administered 2024-08-10 (×2): 40 meq via ORAL
  Filled 2024-08-10 (×2): qty 2

## 2024-08-10 MED ORDER — HYDROMORPHONE HCL 1 MG/ML IJ SOLN
0.5000 mg | INTRAMUSCULAR | Status: DC | PRN
  Filled 2024-08-10: qty 1

## 2024-08-10 MED ORDER — PROCHLORPERAZINE EDISYLATE 10 MG/2ML IJ SOLN
10.0000 mg | Freq: Four times a day (QID) | INTRAMUSCULAR | Status: DC | PRN
Start: 2024-08-10 — End: 2024-08-12
  Administered 2024-08-10: 10 mg via INTRAVENOUS
  Filled 2024-08-10 (×2): qty 2

## 2024-08-10 NOTE — Progress Notes (Signed)
 [facility]- General Surgery  SURGICAL PROGRESS NOTE  Hospital Day(s): 1.   Interval History:  Patient seen and examined.  States he had a hard time falling asleep last night, unrelated to pain. States his abdominal discomfort is improving. Treating conservatively with PPI per GI recommendations. Denies any chest pain or shortness of breath.  Vital signs in last 24 hours: [min-max] current  Temp:  [97.8 F (36.6 C)-98.7 F (37.1 C)] 97.8 F (36.6 C) (09/23 0836) Pulse Rate:  [56-70] 59 (09/23 0836) Resp:  [12-18] 16 (09/23 0836) BP: (116-140)/(64-82) 131/76 (09/23 0836) SpO2:  [95 %-100 %] 100 % (09/23 0836)     Height: 6' 2 (188 cm) Weight: 129.3 kg BMI (Calculated): 36.58   Intake/Output last 2 shifts:  09/22 0701 - 09/23 0700 In: 1151 [I.V.:1151] Out: -    Physical Exam:  Constitutional: alert, cooperative and no distress  Respiratory: breathing non-labored at rest  Cardiovascular: regular rate and sinus rhythm  Gastrointestinal: soft, mildly TTP on epigastric region, and non-distended  Labs:     Latest Ref Rng & Units 08/10/2024    5:09 AM 08/09/2024    7:38 AM 10/08/2017    4:00 PM  CBC  WBC 4.0 - 10.5 K/uL 7.8  10.1  9.7   Hemoglobin 13.0 - 17.0 g/dL 82.3  81.3  84.3   Hematocrit 39.0 - 52.0 % 51.5  52.9  45.2   Platelets 150 - 400 K/uL 255  292  271       Latest Ref Rng & Units 08/10/2024    5:09 AM 08/09/2024    7:38 AM 04/30/2022    3:36 PM  CMP  Glucose 70 - 99 mg/dL 88  886    BUN 8 - 23 mg/dL 10  8    Creatinine 9.38 - 1.24 mg/dL 9.48  9.43  8.89   Sodium 135 - 145 mmol/L 133  131    Potassium 3.5 - 5.1 mmol/L 3.2  3.2    Chloride 98 - 111 mmol/L 94  89    CO2 22 - 32 mmol/L 29  26    Calcium 8.9 - 10.3 mg/dL 8.4  8.9    Total Protein 6.5 - 8.1 g/dL  6.7    Total Bilirubin 0.0 - 1.2 mg/dL  1.1    Alkaline Phos 38 - 126 U/L  63    AST 15 - 41 U/L  19    ALT 0 - 44 U/L  12      Imaging studies: No new pertinent imaging  studies   Assessment/Plan:  63 y.o. male with likely duodenitis, complicated by pertinent comorbidities including hypertension and history of kidney stones.    - Overall, improving clinically. No surgical intervention needed at this point. Continue to manage conservatively. Follow hospitalist and GI recommendations. Surgery will sign off. Still available for questions and concerns.   -- Gilmer Cea PA-C

## 2024-08-10 NOTE — Progress Notes (Signed)
 PHARMACY - ANTICOAGULATION CONSULT NOTE  Pharmacy Consult for Heparin  Infusion Indication: pulmonary embolus  No Known Allergies  Patient Measurements: Height: 6' 2 (188 cm) Weight: 129.3 kg (285 lb) IBW/kg (Calculated) : 82.2 HEPARIN  DW (KG): 110.7  Vital Signs: Temp: 97.8 F (36.6 C) (09/23 0836) Temp Source: Oral (09/23 0836) BP: 131/76 (09/23 0836) Pulse Rate: 59 (09/23 0836)  Labs: Recent Labs    08/09/24 0738 08/09/24 1030 08/09/24 1125 08/09/24 1614 08/09/24 2153 08/10/24 0509 08/10/24 1250  HGB 18.6*  --   --   --   --  17.6*  --   HCT 52.9*  --   --   --   --  51.5  --   PLT 292  --   --   --   --  255  --   APTT  --   --  26  --   --   --   --   LABPROT  --   --  13.2  --   --   --   --   INR  --   --  1.0  --   --   --   --   HEPARINUNFRC  --   --   --    < > 0.21* 0.12* 0.70  CREATININE 0.56*  --   --   --   --  0.51*  --   TROPONINIHS 5 5  --   --   --   --   --    < > = values in this interval not displayed.    Estimated Creatinine Clearance: 135 mL/min (A) (by C-G formula based on SCr of 0.51 mg/dL (L)).   Medical History: Past Medical History:  Diagnosis Date   Hypertension    Kidney stones     Medications:  Not on anticoagulation at home per chart review  Assessment: Patient is a 63 year old male with a past medical history of hypertension, and cholecystectomy who presented to the ED with right-sided pain. Patient has elevated D-dimer of 1.25 and CT chest shows small burden pulmonary emboli in the right middle and right lower lobes. Pharmacy was consulted to initiate patient on a heparin  infusion.  Baseline INR and aPTT ordered. Hgb 18.6. PLT 292. No signs/symptoms of bleeding noted in chart.   Date Time Results Comments 9/22 1614 HL=0.35 Therapeutic x 1, rate 1750 units/hr (done early) 9/22     2153   HL=0.21           SUBtherapeutic  9/23     0509   HL=0.12           SUBtherapeutic  9/23 1250 HL=0.70 Therapeutic x 1, rate 2350  units/hr  Goal of Therapy:  Heparin  level 0.3-0.7 units/ml Monitor platelets by anticoagulation protocol: Yes   Plan:  9/23 @ 1250  HL=0.70 Therapeutic x 1, rate 2350 units/hr Continue heparin  infusion at 2350 units/hr Recheck HL in 6 hours Monitor CBC daily while on heparin  Monitor for appropriate transition to oral anticoagulation   Will M. Lenon, PharmD, BCPS Clinical Pharmacist 08/10/2024 1:29 PM

## 2024-08-10 NOTE — Progress Notes (Signed)
 PHARMACY - ANTICOAGULATION CONSULT NOTE  Pharmacy Consult for Heparin  Infusion Indication: pulmonary embolus  No Known Allergies  Patient Measurements: Height: 6' 2 (188 cm) Weight: 129.3 kg (285 lb) IBW/kg (Calculated) : 82.2 HEPARIN  DW (KG): 110.7  Vital Signs: Temp: 98.3 F (36.8 C) (09/23 0425) Temp Source: Oral (09/22 1841) BP: 116/69 (09/23 0425) Pulse Rate: 56 (09/23 0425)  Labs: Recent Labs    08/09/24 0738 08/09/24 1030 08/09/24 1125 08/09/24 1614 08/09/24 2153 08/10/24 0509  HGB 18.6*  --   --   --   --  17.6*  HCT 52.9*  --   --   --   --  51.5  PLT 292  --   --   --   --  255  APTT  --   --  26  --   --   --   LABPROT  --   --  13.2  --   --   --   INR  --   --  1.0  --   --   --   HEPARINUNFRC  --   --   --  0.35 0.21* 0.12*  CREATININE 0.56*  --   --   --   --   --   TROPONINIHS 5 5  --   --   --   --     Estimated Creatinine Clearance: 135 mL/min (A) (by C-G formula based on SCr of 0.56 mg/dL (L)).   Medical History: Past Medical History:  Diagnosis Date   Hypertension    Kidney stones     Medications:  Not on anticoagulation at home per chart review  Assessment: Patient is a 63 year old male with a past medical history of hypertension, and cholecystectomy who presented to the ED with right-sided pain. Patient has elevated D-dimer of 1.25 and CT chest shows small burden pulmonary emboli in the right middle and right lower lobes. Pharmacy was consulted to initiate patient on a heparin  infusion.  Baseline INR and aPTT ordered. Hgb 18.6. PLT 292. No signs/symptoms of bleeding noted in chart.   Date Time Results Comments 9/22 1614 HL=0.35 Therapeutic x 1, rate 1750 units/hr (done early) 9/22     2153   HL=0.21           SUBtherapeutic  9/23     0509   HL=0.12           SUBtherapeutic   Goal of Therapy:  Heparin  level 0.3-0.7 units/ml Monitor platelets by anticoagulation protocol: Yes   Plan:  9/23:  HL @ 0509 = 0.12, SUBtherapeutic  -  will order heparin  3100 units IV X 1 bolus and increase drip rate to 2350 units/hr  - Recheck HL 6 hrs after rate change  Monitor CBC daily while on heparin  Monitor for appropriate transition to oral anticoagulation   Clerence Gubser D 08/10/2024 6:33 AM

## 2024-08-10 NOTE — Progress Notes (Signed)
   08/10/24 1400  Oxygen Therapy  SpO2 99 %  O2 Device Room Air  Mobility  Activity Ambulated with assistance;Stood at bedside  Level of Assistance Contact guard assist, steadying assist  Assistive Device Front wheel walker  Distance Ambulated (ft) 170 ft  Range of Motion/Exercises Active Assistive  Activity Response Tolerated well  Mobility visit 1 Mobility  Mobility Specialist Start Time (ACUTE ONLY) 0127  Mobility Specialist Stop Time (ACUTE ONLY) 0210  Mobility Specialist Time Calculation (min) (ACUTE ONLY) 43 min   Mobility Specialist - Progress Note  Pt was supine in bed upon entry. Pt had a guest in the room upon entry. Pt agreed to mobility. Pt was able to EOB independently. Pt was able to dangle feet at the edge of bed as a warm up technique. Pt had additional guest enter the room during this activity. Paused for pt and guest. Nurse provided medication before mobility. Pt was able to STS independently. Pt didn't complain about motion. Pt ambulated well. Checked pt O2 midway through activity. O2 levels consistently stayed above 94%. After activity pt returned to the bed with needs in reach.  Clem Rodes Mobility Specialist 08/10/24, 2:22 PM

## 2024-08-10 NOTE — Progress Notes (Signed)
 Progress Note   Patient: Brian [name] FMW:[id number] DOB: 06-May-1961 DOA: 08/09/2024     1 DOS: the patient was seen and examined on 08/10/2024   Brief hospital course:  Brian [name] is a 63 y.o. male with medical history significant of HTN, alcohol abuse, morbid obesity, presented with worsening abdominal pain and concern for chest pain.   Symptoms started about 1 month ago, patient started to have intermittent epigastric abdominal pain, sharp-like, nonradiating, usually happens in early mornings, abdominal pain can be relieved by taking early breakfast and sometimes ibuprofen  and patient has been taking ibuprofen  almost every day for the last 2 to 3 weeks in the morning.  He drinks 1 cocktail glass of liquor every night.  Last 3 days, he started develop sharp like right-sided chest pain, intermittent, worsening with cough.  Denies any fever or chills. ED Course: Afebrile, no tachycardia blood pressure 150/70 O2 saturation 100% on room air.  Blood work showed WBC 10.1 hemoglobin 18.6 BUN 8 creatinine 0.5 sodium 135 potassium 3.2.   CT abdominal pelvis that showed inflammation of duodenum versus duodenum diverticulitis versus contained perforation of duodenum.  Film was reviewed by surgeon and GI, both conclusion there is left IV perforation, likely more representative duodenum ulcer secondary to NSAIDs and alcohol use.   CTA chest showed possible right middle and lower lobe subsegmental PE, low blood clot burden, no signs of right heart strain.  Troponin negative x 2.   Patient was admitted to the hospital, started on IV heparin .  GI and Surgery consulted in the ED for input on duodenal findings on CT.  Due to acute VTE and need for anticoagulation, conservative management for presumed PUD with PPI was recommended.    Further hospital course and management as outlined below.   Assessment and Plan:  Acute right-sided PE, unprovoked - small burden, no right-heart strain seen on CTA Left Lower  Extremity DVT - small volume DVT seen in left calf veins --Continue IV heparin  --Transition to Eliquis  when clinically appropriate from abdominal standpoint and we're certain no procedures --Echo pending - follow --Outpatient referral on d/c to hematology for hypercoagulable workup --No hypoxia.  Monitor & supplement O2 PRN.   Acute epigastric pain Duodenal wall thickening on imaging. Contained perforated duodenum ulcer suspected. Clinically suspect acute on ulcer given the fact that patient's symptoms involving empty stomach pain, happens in the mornings relieved by eating meals, on top of it, patient uses alcohol and NSAIDs almost every day for at least 3 to 4 weeks further increased risk. - Case was discussed on admission with surgeon Dr. Tye at bedside, who went through the imaging study, recommend no surgical intervention, continue PPI, GI rest, IV fluids. - GI was consulted in the ED - if any signs of GI bleeding, may need urgent EGD.  Otherwise hold any intervention with acute need for anticoagulation - On clear liquids for now - IV PPI BID - Discharge on PPI BID  - GI follow up after d/c - Monitor for signs of GI bleeding (no BM since admission as of this AM) - Monitor abdominal exam & repeat imaging if needed   Hypokalemia - K 3.2, Mg normal - P.o. replacement    Hyponatremia - Na 131 >> 133  - Monitor BMP   Alcohol use No symptoms or signs of active withdrawal - CIWA protocol with as needed benzos   Obesity - BMI= 36 - Calorie control recommended      Subjective: Pt seen with family  at bedside this AM.  He developed recurrent abdominal pain this AM and nausea.  No chest pain at rest or with inspiration.  No dyspnea or dizziness or lightheadedness.     Physical Exam: Vitals:   08/10/24 0425 08/10/24 0836 08/10/24 1400 08/10/24 1505  BP: 116/69 131/76  124/75  Pulse: (!) 56 (!) 59  62  Resp: 18 16  18   Temp: 98.3 F (36.8 C) 97.8 F (36.6 C)  98 F (36.7  C)  TempSrc:  Oral  Oral  SpO2: 99% 100% 99% 96%  Weight:      Height:       General exam: awake, alert, no acute distress, obese HEENT: moist mucus membranes, hearing grossly normal  Respiratory system: CTAB, no wheezes, rales or rhonchi, normal respiratory effort. Cardiovascular system: normal S1/S2, RRR, no pedal edema.   Gastrointestinal system: soft, epigastric tenderness without rebound or guarding Central nervous system: A&O x3. no gross focal neurologic deficits, normal speech Extremities: moves all, no edema, normal tone Skin: dry, intact, normal temperature, normal color, No rashes, lesions or ulcers Psychiatry: normal mood, congruent affect, judgement and insight appear normal   Data Reviewed:  Notable labs -- Na 133, K 3.2, Cl 94, Cr 0.51, Ca 8.4  Family Communication: at bedside on rounds  Disposition: Status is: Inpatient Remains inpatient appropriate because: on IV therapies as above   Planned Discharge Destination: Home    Time spent: 45 minutes  Author: Burnard DELENA Cunning, DO 08/10/2024 3:36 PM  For on call review [email or web address].

## 2024-08-10 NOTE — Progress Notes (Signed)
 PHARMACY - ANTICOAGULATION CONSULT NOTE  Pharmacy Consult for Heparin  Infusion Indication: pulmonary embolus  No Known Allergies  Patient Measurements: Height: 6' 2 (188 cm) Weight: 129.3 kg (285 lb) IBW/kg (Calculated) : 82.2 HEPARIN  DW (KG): 110.7  Vital Signs: Temp: 98.7 F (37.1 C) (09/23 2015) Temp Source: Oral (09/23 2015) BP: 130/70 (09/23 2015) Pulse Rate: 60 (09/23 2015)  Labs: Recent Labs    08/09/24 0738 08/09/24 1030 08/09/24 1125 08/09/24 1614 08/10/24 0509 08/10/24 1250 08/10/24 1609 08/10/24 1745 08/10/24 2036  HGB 18.6*  --   --   --  17.6*  --   --   --   --   HCT 52.9*  --   --   --  51.5  --   --   --   --   PLT 292  --   --   --  255  --   --   --   --   APTT  --   --  26  --   --   --   --   --   --   LABPROT  --   --  13.2  --   --   --   --   --   --   INR  --   --  1.0  --   --   --   --   --   --   HEPARINUNFRC  --   --   --    < > 0.12* 0.70  --   --  0.52  CREATININE 0.56*  --   --   --  0.51*  --   --   --   --   TROPONINIHS 5 5  --   --   --   --  6 6  --    < > = values in this interval not displayed.    Estimated Creatinine Clearance: 135 mL/min (A) (by C-G formula based on SCr of 0.51 mg/dL (L)).   Medical History: Past Medical History:  Diagnosis Date   Hypertension    Kidney stones     Medications:  Not on anticoagulation at home per chart review  Assessment: Patient is a 63 year old male with a past medical history of hypertension, and cholecystectomy who presented to the ED with right-sided pain. Patient has elevated D-dimer of 1.25 and CT chest shows small burden pulmonary emboli in the right middle and right lower lobes. Pharmacy was consulted to initiate patient on a heparin  infusion.  Baseline INR and aPTT ordered. Hgb 18.6. PLT 292. No signs/symptoms of bleeding noted in chart.   Date Time Results Comments 9/22 1614 HL=0.35 Therapeutic x 1, rate 1750 units/hr (done early) 9/22     2153   HL=0.21            SUBtherapeutic  9/23     0509   HL=0.12           SUBtherapeutic  9/23 1250 HL=0.70 Therapeutic x 1, rate 2350 units/hr 9/23 2036 HL = 0.52 Therapeutic x2  Goal of Therapy:  Heparin  level 0.3-0.7 units/ml Monitor platelets by anticoagulation protocol: Yes   Plan:  Continue heparin  infusion at 2350 units/hr Recheck HL in AM Monitor CBC daily while on heparin  Monitor for appropriate transition to oral anticoagulation   Thank you for involving pharmacy in this patient's care.   Damien Napoleon, PharmD Clinical Pharmacist 08/10/2024 9:22 PM

## 2024-08-10 NOTE — TOC Initial Note (Signed)
 Transition of Care [facility]) - Initial/Assessment Note    Patient Details  Name: Brian [name] MRN: [id number] Date of Birth: 05-14-1961  Transition of Care [facility]) CM/SW Contact:    Alvaro Louder, LCSW Phone Number: 08/10/2024, 11:51 AM  Clinical Narrative:   Per Chart review patient is from home. PCP is Vishwanath Hande. LCSWA received consult and placed somke cessation information in patients AVS at discharge.   TOC to follow for discharge                    Patient Goals and CMS Choice            Expected Discharge Plan and Services                                              Prior Living Arrangements/Services                       Activities of Daily Living   ADL Screening (condition at time of admission) Independently performs ADLs?: Yes (appropriate for developmental age) Is the patient deaf or have difficulty hearing?: No Does the patient have difficulty seeing, even when wearing glasses/contacts?: No Does the patient have difficulty concentrating, remembering, or making decisions?: No  Permission Sought/Granted                  Emotional Assessment              Admission diagnosis:  Pulmonary emboli (HCC) [I26.99] Duodenitis [K29.80] Multiple subsegmental pulmonary emboli without acute cor pulmonale (HCC) [I26.94] Patient Active Problem List   Diagnosis Date Noted   Acute pulmonary embolism (HCC) 08/09/2024   Abdominal pain 08/09/2024   Pulmonary emboli (HCC) 08/09/2024   Abdominal pain, chronic, epigastric 08/09/2024   Abnormal CT of the abdomen 08/09/2024   PCP:  Sadie Manna, MD Pharmacy:   [facility] [phone] [address], [facility] - [address] [place] [address] Phone: [phone] Fax: [phone]     Social Drivers of Health (SDOH) Social History: SDOH Screenings   Food Insecurity: No Food Insecurity (08/09/2024)  Housing: Low Risk  (08/09/2024)  Transportation Needs: No  Transportation Needs (08/09/2024)  Utilities: Not At Risk (08/09/2024)  Financial Resource Strain: Low Risk  (02/28/2023)   Received from [facility]  Social Connections: Socially Integrated (08/09/2024)  Tobacco Use: High Risk (08/09/2024)   SDOH Interventions:     Readmission Risk Interventions     No data to display

## 2024-08-11 ENCOUNTER — Inpatient Hospital Stay
Admit: 2024-08-11 | Discharge: 2024-08-11 | Disposition: A | Discharge status: Home / Self Care | Payer: PRIVATE HEALTH INSURANCE | Attending: Internal Medicine

## 2024-08-11 DIAGNOSIS — I2699 Other pulmonary embolism without acute cor pulmonale: Secondary | ICD-10-CM | POA: Diagnosis not present

## 2024-08-11 LAB — HEPARIN LEVEL (UNFRACTIONATED): Heparin Unfractionated: 0.68 [IU]/mL (ref 0.30–0.70)

## 2024-08-11 LAB — COMPREHENSIVE METABOLIC PANEL WITH GFR
ALT: 9 U/L (ref 0–44)
AST: 13 U/L — ABNORMAL LOW (ref 15–41)
Albumin: 2.6 g/dL — ABNORMAL LOW (ref 3.5–5.0)
Alkaline Phosphatase: 48 U/L (ref 38–126)
Anion gap: 13 (ref 5–15)
BUN: 14 mg/dL (ref 8–23)
CO2: 25 mmol/L (ref 22–32)
Calcium: 8.3 mg/dL — ABNORMAL LOW (ref 8.9–10.3)
Chloride: 96 mmol/L — ABNORMAL LOW (ref 98–111)
Creatinine, Ser: 0.65 mg/dL (ref 0.61–1.24)
GFR, Estimated: >60 mL/min (ref >60)
Glucose, Bld: 85 mg/dL (ref 70–99)
Potassium: 4.3 mmol/L (ref 3.5–5.1)
Sodium: 134 mmol/L — ABNORMAL LOW (ref 135–145)
Total Bilirubin: 1.3 mg/dL — ABNORMAL HIGH (ref 0.0–1.2)
Total Protein: 5.4 g/dL — ABNORMAL LOW (ref 6.5–8.1)

## 2024-08-11 LAB — VITAMIN D 25 HYDROXY (VIT D DEFICIENCY, FRACTURES): Vit D, 25-Hydroxy: 84.29 ng/mL (ref 30–100)

## 2024-08-11 LAB — ECHOCARDIOGRAM COMPLETE
Height: 74 in
S' Lateral: 2.89 cm
Weight: 4560 [oz_av]

## 2024-08-11 LAB — CBC
HCT: 48.5 % (ref 39.0–52.0)
Hemoglobin: 16.1 g/dL (ref 13.0–17.0)
MCH: 32.4 pg (ref 26.0–34.0)
MCHC: 33.2 g/dL (ref 30.0–36.0)
MCV: 97.6 fL (ref 80.0–100.0)
Platelets: 241 K/uL (ref 150–400)
RBC: 4.97 MIL/uL (ref 4.22–5.81)
RDW: 13.3 % (ref 11.5–15.5)
WBC: 7.1 K/uL (ref 4.0–10.5)
nRBC: 0 % (ref 0.0–0.2)

## 2024-08-11 LAB — MAGNESIUM: Magnesium: 1.5 mg/dL — ABNORMAL LOW (ref 1.7–2.4)

## 2024-08-11 LAB — FOLATE: Folate: 7 ng/mL (ref >5.9)

## 2024-08-11 LAB — LIPASE, BLOOD: Lipase: 39 U/L (ref 11–51)

## 2024-08-11 LAB — PHOSPHORUS: Phosphorus: 2.5 mg/dL (ref 2.5–4.6)

## 2024-08-11 LAB — VITAMIN B12: Vitamin B-12: 413 pg/mL (ref 180–914)

## 2024-08-11 MED ORDER — ENSURE PLUS HIGH PROTEIN PO LIQD
237.0000 mL | Freq: Two times a day (BID) | ORAL | Status: DC
Start: 2024-08-11 — End: 2024-08-12
  Administered 2024-08-11 – 2024-08-12 (×3): 237 mL via ORAL

## 2024-08-11 MED ORDER — MAGNESIUM SULFATE 4 GM/100ML IV SOLN
4.0000 g | Freq: Once | INTRAVENOUS | Status: AC
  Administered 2024-08-11: 4 g via INTRAVENOUS
  Filled 2024-08-11: qty 100

## 2024-08-11 NOTE — Progress Notes (Signed)
   08/11/24 1500  Mobility  Activity Ambulated independently;Stood at bedside;Respositioned in chair  Level of Assistance Independent after set-up  Assistive Device None  Distance Ambulated (ft) 220 ft  Range of Motion/Exercises All extremities  Activity Response Tolerated well  Mobility visit 1 Mobility  Mobility Specialist Start Time (ACUTE ONLY) 1500  Mobility Specialist Stop Time (ACUTE ONLY) 1513  Mobility Specialist Time Calculation (min) (ACUTE ONLY) 13 min   Pt was supine in bed upon entry. Pt agreed to additional mobility. Pt ambulated well without 2 [employer]. After activity pt returned to room repositioned in recliner with needs in reach.  Clem Rodes Mobility Specialist 08/11/24, 3:18 PM

## 2024-08-11 NOTE — Progress Notes (Signed)
   08/11/24 1000  Mobility  Activity Ambulated with assistance;Stood at bedside;Respositioned in chair  Level of Assistance Standby assist, set-up cues, supervision of patient - no hands on  Assistive Device Front wheel walker  Distance Ambulated (ft) 200 ft  Range of Motion/Exercises Active Assistive  Activity Response Tolerated well  Mobility visit 1 Mobility  Mobility Specialist Start Time (ACUTE ONLY) [id number]  Mobility Specialist Stop Time (ACUTE ONLY) [id number]  Mobility Specialist Time Calculation (min) (ACUTE ONLY) 22 min   Mobility Specialist - Progress Note Pt was in the bed upon entry. Pt agreed to mobility. Pt didn't complain of any pain or aches from the day prior. Pt was able to EOB independently. Pt was able to STS independently. Pt ambulated well with 2 [employer]. After activity pt was able to reposition themselves into the recliner with needs in reach.  Clem Rodes Mobility Specialist 08/11/24, 10:53 AM

## 2024-08-11 NOTE — Progress Notes (Signed)
 [facility] Progress Note  Patient: Brian [name]    FMW:[id number]  DOA: 08/09/2024     Date of Service: the patient was seen and examined on 08/11/2024  Chief Complaint  Patient presents with   Chest Pain   Brief hospital course: Brian [name] is a 63 y.o. male with medical history significant of HTN, alcohol abuse, morbid obesity, presented with worsening abdominal pain and concern for chest pain.   Symptoms started about 1 month ago, patient started to have intermittent epigastric abdominal pain, sharp-like, nonradiating, usually happens in early mornings, abdominal pain can be relieved by taking early breakfast and sometimes ibuprofen  and patient has been taking ibuprofen  almost every day for the last 2 to 3 weeks in the morning.  He drinks 1 cocktail glass of liquor every night.  Last 3 days, he started develop sharp like right-sided chest pain, intermittent, worsening with cough.  Denies any fever or chills. ED Course: Afebrile, no tachycardia blood pressure 150/70 O2 saturation 100% on room air.  Blood work showed WBC 10.1 hemoglobin 18.6 BUN 8 creatinine 0.5 sodium 135 potassium 3.2.   CT abdominal pelvis that showed inflammation of duodenum versus duodenum diverticulitis versus contained perforation of duodenum.  Film was reviewed by surgeon and GI, both conclusion there is left IV perforation, likely more representative duodenum ulcer secondary to NSAIDs and alcohol use.   CTA chest showed possible right middle and lower lobe subsegmental PE, low blood clot burden, no signs of right heart strain.  Troponin negative x 2.     Patient was admitted to the hospital, started on IV heparin .  GI and Surgery consulted in the ED for input on duodenal findings on CT.  Due to acute VTE and need for anticoagulation, conservative management for presumed PUD with PPI was recommended.     Further hospital course and management as outlined below.   Assessment and Plan: Acute right-sided PE,  unprovoked - small burden, no right-heart strain seen on CTA Left Lower Extremity DVT - small volume DVT seen in left calf veins --Continue IV heparin  --Transition to Eliquis  when clinically appropriate from abdominal standpoint and we're certain no procedures --Echo pending - follow --Outpatient referral on d/c to hematology for hypercoagulable workup --No hypoxia.  Monitor & supplement O2 PRN.   Acute epigastric pain Duodenal wall thickening on imaging. Contained perforated duodenum ulcer suspected. Clinically suspect acute on ulcer given the fact that patient's symptoms involving empty stomach pain, happens in the mornings relieved by eating meals, on top of it, patient uses alcohol and NSAIDs almost every day for at least 3 to 4 weeks further increased risk. - Case was discussed on admission with surgeon Dr. Tye at bedside, who went through the imaging study, recommend no surgical intervention, continue PPI, GI rest, IV fluids. - GI was consulted in the ED - if any signs of GI bleeding, may need urgent EGD.  Otherwise hold any intervention with acute need for anticoagulation - On clear liquids for now - IV PPI BID - Discharge on PPI BID  - GI follow up after d/c - Monitor for signs of GI bleeding (no BM since admission as of this AM) - Monitor abdominal exam & repeat imaging if needed   Hypokalemia - K 3.2, Mg normal - P.o. replacement # Hypomagnesemia, mag repleted. Check electrolytes daily and replete if needed.   Hyponatremia - Na 131 >> 133  - Monitor BMP   Alcohol use: Abstinence counseling No symptoms or signs of active  withdrawal - CIWA protocol with as needed benzos  Folic acid  level 7.0, at lower end, started folic acid  supplement to prevent deficiency  Obesity class II Body mass index is 36.59 kg/m.  Interventions:Calorie restricted diet and daily exercise advised to lose body weight.  Lifestyle modification discussed.   Diet: FLD>>soft diet DVT Prophylaxis:  Heparin  IV infusion  Advance goals of care discussion: Full code  Family Communication: family was present at bedside, at the time of interview.  The pt provided permission to discuss medical plan with the family. Opportunity was given to ask question and all questions were answered satisfactorily.   Disposition:  Pt is from Home, admitted with PE, LLE DVT, perforated duodenal ulcer, EtOH abuse, still on heparin  IV infusion and full liquid diet, which precludes a safe discharge. Discharge to home, when stable, most likely in 1 to 2 days.  Subjective: No significant events overnight, patient denies any significant abdominal pain, no chest pain or palpitation or shortness of breath.  Patient is tolerating full liquid diet well.  Physical Exam: General: NAD, lying comfortably Appear in no distress, affect appropriate Eyes: PERRLA ENT: Oral Mucosa Clear, moist  Neck: no JVD,  Cardiovascular: S1 and S2 Present, no Murmur,  Respiratory: good respiratory effort, Bilateral Air entry equal and Decreased, no Crackles, no wheezes Abdomen: BS present, Soft and mild tenderness,  Skin: no rashes Extremities: no Pedal edema, no calf tenderness Neurologic: without any new focal findings Gait not checked due to patient safety concerns  Vitals:   08/10/24 1535 08/10/24 2015 08/11/24 0440 08/11/24 0844  BP: (!) 115/50 130/70 132/70 126/64  Pulse: (!) 59 60 62 68  Resp: 17 16 16 18   Temp: 98.1 F (36.7 C) 98.7 F (37.1 C) 97.9 F (36.6 C) 98.3 F (36.8 C)  TempSrc: Oral Oral Oral Oral  SpO2: 95% 98% 99% 100%  Weight:      Height:        Intake/Output Summary (Last 24 hours) at 08/11/2024 1503 Last data filed at 08/11/2024 1300 Gross per 24 hour  Intake 540 ml  Output --  Net 540 ml   Filed Weights   08/09/24 1101  Weight: 129.3 kg    Data Reviewed: I have personally reviewed and interpreted daily labs, tele strips, imagings as discussed above. I reviewed all nursing notes, pharmacy  notes, vitals, pertinent old records I have discussed plan of care as described above with RN and patient/family.  CBC: Recent Labs  Lab 08/09/24 0738 08/10/24 0509 08/11/24 0612  WBC 10.1 7.8 7.1  HGB 18.6* 17.6* 16.1  HCT 52.9* 51.5 48.5  MCV 91.7 94.5 97.6  PLT 292 255 241   Basic Metabolic Panel: Recent Labs  Lab 08/09/24 0738 08/09/24 1614 08/10/24 0509 08/11/24 0612  NA 131*  --  133* 134*  K 3.2*  --  3.2* 4.3  CL 89*  --  94* 96*  CO2 26  --  29 25  GLUCOSE 113*  --  88 85  BUN 8  --  10 14  CREATININE 0.56*  --  0.51* 0.65  CALCIUM 8.9  --  8.4* 8.3*  MG  --  1.9  --  1.5*  PHOS  --   --   --  2.5    Studies: ECHOCARDIOGRAM COMPLETE Result Date: 08/11/2024    ECHOCARDIOGRAM REPORT   Patient Name:   NORLEEN DELENA LAINE Date of Exam: 08/11/2024 Medical Rec #:  [id number]   Height:  74.0 in Accession #:    [id number]  Weight:       285.0 lb Date of Birth:  Mar 15, 1961   BSA:          2.527 m Patient Age:    63 years    BP:           132/70 mmHg Patient Gender: M           HR:           62 bpm. Exam Location:  [facility] Procedure: 2D Echo, Cardiac Doppler and Color Doppler (Both Spectral and Color            Flow Doppler were utilized during procedure). Indications:     Pulmonary embolus I26.09  History:         Patient has no prior history of Echocardiogram examinations.                  Risk Factors:Hypertension.  Sonographer:     Christopher Furnace Referring Phys:  [id number] CORT ONEIDA MANA Diagnosing Phys: Keller Paterson  Sonographer Comments: Technically challenging study due to limited acoustic windows and no apical window. IMPRESSIONS  1. Technically difficult study, no apical window.  2. Left ventricular ejection fraction, by estimation, is 55 to 60%. The left ventricle has normal function. Left ventricular endocardial border not optimally defined to evaluate regional wall motion. There is mild left ventricular hypertrophy. Left ventricular diastolic parameters are indeterminate.  3. Right  ventricular systolic function is normal. The right ventricular size is normal.  4. The mitral valve is normal in structure. Trivial mitral valve regurgitation.  5. The aortic valve was not well visualized. Aortic valve regurgitation is not visualized.  6. The inferior vena cava is normal in size with greater than 50% respiratory variability, suggesting right atrial pressure of 3 mmHg. FINDINGS  Left Ventricle: Left ventricular ejection fraction, by estimation, is 55 to 60%. The left ventricle has normal function. Left ventricular endocardial border not optimally defined to evaluate regional wall motion. The left ventricular internal cavity size was normal in size. There is mild left ventricular hypertrophy. Left ventricular diastolic parameters are indeterminate. Right Ventricle: The right ventricular size is normal. No increase in right ventricular wall thickness. Right ventricular systolic function is normal. Left Atrium: Left atrial size was not well visualized. Right Atrium: Right atrial size was not well visualized. Pericardium: There is no evidence of pericardial effusion. Mitral Valve: The mitral valve is normal in structure. Trivial mitral valve regurgitation. Tricuspid Valve: The tricuspid valve is not well visualized. Tricuspid valve regurgitation is trivial. Aortic Valve: The aortic valve was not well visualized. Aortic valve regurgitation is not visualized. Pulmonic Valve: The pulmonic valve was not well visualized. Pulmonic valve regurgitation is not visualized. Aorta: The aortic root is normal in size and structure. Venous: The inferior vena cava is normal in size with greater than 50% respiratory variability, suggesting right atrial pressure of 3 mmHg. IAS/Shunts: The interatrial septum was not well visualized.  LEFT VENTRICLE PLAX 2D LVIDd:         4.00 cm LVIDs:         2.89 cm LV PW:         1.72 cm LV IVS:        1.40 cm LVOT diam:     2.20 cm LVOT Area:     3.80 cm  LEFT ATRIUM         Index LA  diam:    4.40 cm 1.74  cm/m   AORTA Ao Root diam: 3.60 cm  SHUNTS Systemic Diam: 2.20 cm Keller Paterson Electronically signed by Keller Paterson Signature Date/Time: 08/11/2024/12:38:30 PM    Final     Scheduled Meds:  escitalopram   10 mg Oral Daily   feeding supplement  237 mL Oral BID BM   folic acid   1 mg Oral Daily   multivitamin with minerals  1 tablet Oral Daily   pantoprazole  (PROTONIX ) IV  40 mg Intravenous Q12H   thiamine   100 mg Oral Daily   Or   thiamine   100 mg Intravenous Daily   Continuous Infusions:  heparin  2,350 Units/hr (08/11/24 0858)   PRN Meds: acetaminophen  **OR** acetaminophen , hydrALAZINE , HYDROmorphone  (DILAUDID ) injection, linaclotide , LORazepam  **OR** LORazepam , ondansetron  **OR** ondansetron  (ZOFRAN ) IV, oxyCODONE , prochlorperazine   Time spent: 55 minutes  Author: ELVAN SOR. MD [facility] 08/11/2024 3:03 PM  To reach On-call, see care teams to locate the attending and reach out to them via [email or web address]. If 7PM-7AM, please contact night-coverage If you still have difficulty reaching the attending provider, please page the [facility] (Director on Call) for [facility] on amion for assistance.

## 2024-08-11 NOTE — Plan of Care (Signed)

## 2024-08-11 NOTE — Progress Notes (Signed)
 PHARMACY - ANTICOAGULATION CONSULT NOTE  Pharmacy Consult for Heparin  Infusion Indication: pulmonary embolus  No Known Allergies  Patient Measurements: Height: 6' 2 (188 cm) Weight: 129.3 kg (285 lb) IBW/kg (Calculated) : 82.2 HEPARIN  DW (KG): 110.7  Vital Signs: Temp: 97.9 F (36.6 C) (09/24 0440) Temp Source: Oral (09/24 0440) BP: 132/70 (09/24 0440) Pulse Rate: 62 (09/24 0440)  Labs: Recent Labs    08/09/24 0738 08/09/24 1030 08/09/24 1125 08/09/24 1614 08/10/24 0509 08/10/24 1250 08/10/24 1609 08/10/24 1745 08/10/24 2036 08/11/24 0612  HGB 18.6*  --   --   --  17.6*  --   --   --   --  16.1  HCT 52.9*  --   --   --  51.5  --   --   --   --  48.5  PLT 292  --   --   --  255  --   --   --   --  241  APTT  --   --  26  --   --   --   --   --   --   --   LABPROT  --   --  13.2  --   --   --   --   --   --   --   INR  --   --  1.0  --   --   --   --   --   --   --   HEPARINUNFRC  --   --   --    < > 0.12* 0.70  --   --  0.52 0.68  CREATININE 0.56*  --   --   --  0.51*  --   --   --   --   --   TROPONINIHS 5 5  --   --   --   --  6 6  --   --    < > = values in this interval not displayed.    Estimated Creatinine Clearance: 135 mL/min (A) (by C-G formula based on SCr of 0.51 mg/dL (L)).   Medical History: Past Medical History:  Diagnosis Date   Hypertension    Kidney stones     Medications:  Not on anticoagulation at home per chart review  Assessment: Patient is a 63 year old male with a past medical history of hypertension, and cholecystectomy who presented to the ED with right-sided pain. Patient has elevated D-dimer of 1.25 and CT chest shows small burden pulmonary emboli in the right middle and right lower lobes. Pharmacy was consulted to initiate patient on a heparin  infusion.  Baseline INR and aPTT ordered. Hgb 18.6. PLT 292. No signs/symptoms of bleeding noted in chart.   Date Time Results Comments 9/22 1614 HL=0.35 Therapeutic x 1, rate 1750  units/hr (done early) 9/22     2153   HL=0.21           SUBtherapeutic  9/23     0509   HL=0.12           SUBtherapeutic  9/23 1250 HL=0.70 Therapeutic x 1, rate 2350 units/hr 9/23 2036 HL = 0.52 Therapeutic x2 9/24 0612 HL = 0.68 Therapeutic  Goal of Therapy:  Heparin  level 0.3-0.7 units/ml Monitor platelets by anticoagulation protocol: Yes   Plan:  Continue heparin  infusion at 2350 units/hr Recheck HL in AM Monitor CBC daily while on heparin  Monitor for appropriate transition to oral anticoagulation   Thank you for involving  pharmacy in this patient's care.   Will M. Lenon, PharmD, BCPS Clinical Pharmacist 08/11/2024 7:17 AM

## 2024-08-11 NOTE — Progress Notes (Signed)
*  PRELIMINARY RESULTS* Echocardiogram 2D Echocardiogram has been performed.  Floydene Harder 08/11/2024, 10:55 AM

## 2024-08-12 ENCOUNTER — Other Ambulatory Visit: Payer: Self-pay

## 2024-08-12 ENCOUNTER — Other Ambulatory Visit (HOSPITAL_COMMUNITY): Payer: Self-pay

## 2024-08-12 ENCOUNTER — Telehealth (HOSPITAL_COMMUNITY): Payer: Self-pay | Admitting: Pharmacy Technician

## 2024-08-12 DIAGNOSIS — I2699 Other pulmonary embolism without acute cor pulmonale: Secondary | ICD-10-CM | POA: Diagnosis not present

## 2024-08-12 LAB — HEPARIN LEVEL (UNFRACTIONATED): Heparin Unfractionated: 0.6 [IU]/mL (ref 0.30–0.70)

## 2024-08-12 LAB — CBC
HCT: 46.3 % (ref 39.0–52.0)
Hemoglobin: 15.5 g/dL (ref 13.0–17.0)
MCH: 32.2 pg (ref 26.0–34.0)
MCHC: 33.5 g/dL (ref 30.0–36.0)
MCV: 96.3 fL (ref 80.0–100.0)
Platelets: 236 K/uL (ref 150–400)
RBC: 4.81 MIL/uL (ref 4.22–5.81)
RDW: 12.9 % (ref 11.5–15.5)
WBC: 8.5 K/uL (ref 4.0–10.5)
nRBC: 0 % (ref 0.0–0.2)

## 2024-08-12 LAB — MAGNESIUM: Magnesium: 2.1 mg/dL (ref 1.7–2.4)

## 2024-08-12 LAB — BASIC METABOLIC PANEL WITH GFR
Anion gap: 8 (ref 5–15)
BUN: 13 mg/dL (ref 8–23)
CO2: 28 mmol/L (ref 22–32)
Calcium: 8 mg/dL — ABNORMAL LOW (ref 8.9–10.3)
Chloride: 96 mmol/L — ABNORMAL LOW (ref 98–111)
Creatinine, Ser: 0.68 mg/dL (ref 0.61–1.24)
GFR, Estimated: >60 mL/min (ref >60)
Glucose, Bld: 88 mg/dL (ref 70–99)
Potassium: 4.1 mmol/L (ref 3.5–5.1)
Sodium: 132 mmol/L — ABNORMAL LOW (ref 135–145)

## 2024-08-12 LAB — PHOSPHORUS: Phosphorus: 2.5 mg/dL (ref 2.5–4.6)

## 2024-08-12 MED ORDER — APIXABAN (ELIQUIS) VTE STARTER PACK (10MG AND 5MG)
ORAL_TABLET | ORAL | 0 refills | Status: AC
  Filled 2024-08-12: qty 74, 30d supply, fill #0

## 2024-08-12 MED ORDER — PANTOPRAZOLE SODIUM 40 MG PO TBEC
DELAYED_RELEASE_TABLET | ORAL | 1 refills | Status: AC
  Filled 2024-08-12: qty 30, 15d supply, fill #0

## 2024-08-12 MED ORDER — APIXABAN 5 MG PO TABS
10.0000 mg | ORAL_TABLET | Freq: Two times a day (BID) | ORAL | Status: DC
  Administered 2024-08-12: 10 mg via ORAL
  Filled 2024-08-12: qty 4
  Filled 2024-08-12: qty 2

## 2024-08-12 MED ORDER — APIXABAN 5 MG PO TABS: 5.0000 mg | ORAL_TABLET | Freq: Two times a day (BID) | ORAL | Status: DC

## 2024-08-12 MED ORDER — ADULT MULTIVITAMIN W/MINERALS CH
1.0000 | ORAL_TABLET | Freq: Every day | ORAL | 0 refills | Status: AC
  Filled 2024-08-12: qty 30, 30d supply, fill #0

## 2024-08-12 MED ORDER — FOLIC ACID 1 MG PO TABS
1.0000 mg | ORAL_TABLET | Freq: Every day | ORAL | 2 refills | Status: AC
  Filled 2024-08-12: qty 30, 30d supply, fill #0

## 2024-08-12 MED ORDER — VITAMIN B-1 100 MG PO TABS
100.0000 mg | ORAL_TABLET | Freq: Every day | ORAL | 0 refills | Status: AC
  Filled 2024-08-12: qty 30, 30d supply, fill #0

## 2024-08-12 NOTE — Discharge Instructions (Signed)
 Information on my medicine - ELIQUIS  (apixaban )  This medication education was reviewed with me or my healthcare representative as part of my discharge preparation.   Why was Eliquis  prescribed for you? Eliquis  was prescribed to treat blood clots that may have been found in the veins of your legs (deep vein thrombosis) or in your lungs (pulmonary embolism) and to reduce the risk of them occurring again.  What do You need to know about Eliquis  ? The starting dose is 10 mg (two 5 mg tablets) taken TWICE daily for the FIRST SEVEN (7) DAYS, then on (enter date)  08/19/24  the dose is reduced to ONE 5 mg tablet taken TWICE daily.  Eliquis  may be taken with or without food.   Try to take the dose about the same time in the morning and in the evening. If you have difficulty swallowing the tablet whole please discuss with your pharmacist how to take the medication safely.  Take Eliquis  exactly as prescribed and DO NOT stop taking Eliquis  without talking to the doctor who prescribed the medication.  Stopping may increase your risk of developing a new blood clot.  Refill your prescription before you run out.  After discharge, you should have regular check-up appointments with your healthcare provider that is prescribing your Eliquis .    What do you do if you miss a dose? If a dose of ELIQUIS  is not taken at the scheduled time, take it as soon as possible on the same day and twice-daily administration should be resumed. The dose should not be doubled to make up for a missed dose.  Important Safety Information A possible side effect of Eliquis  is bleeding. You should call your healthcare provider right away if you experience any of the following: Bleeding from an injury or your nose that does not stop. Unusual colored urine (red or dark brown) or unusual colored stools (red or black). Unusual bruising for unknown reasons. A serious fall or if you hit your head (even if there is no  bleeding).  Some medicines may interact with Eliquis  and might increase your risk of bleeding or clotting while on Eliquis . To help avoid this, consult your healthcare provider or pharmacist prior to using any new prescription or non-prescription medications, including herbals, vitamins, non-steroidal anti-inflammatory drugs (NSAIDs) and supplements.  This website has more information on Eliquis  (apixaban ): http://[email or web address] .com/eliquis dena

## 2024-08-12 NOTE — Progress Notes (Signed)
 Pt is being discharged to home. Family at side. Pt alert and oriented x4. Able to independently ambulate at will.  Discharge orders given to pt with understanding. Bilateral IV to [facility] removed. Both catheter and tip intact. Both site areas covered with gauze and tape. Staff to wheelchair pt down to the lobby for exit.

## 2024-08-12 NOTE — Progress Notes (Signed)
 PHARMACY - ANTICOAGULATION CONSULT NOTE  Pharmacy Consult for Heparin  Infusion Indication: pulmonary embolus  No Known Allergies  Patient Measurements: Height: 6' 2 (188 cm) Weight: 129.3 kg (285 lb) IBW/kg (Calculated) : 82.2 HEPARIN  DW (KG): 110.7  Vital Signs: Temp: 97.6 F (36.4 C) (09/25 0411) BP: 128/71 (09/25 0411) Pulse Rate: 54 (09/25 0411)  Labs: Recent Labs    08/09/24 1030 08/09/24 1125 08/09/24 1614 08/10/24 0509 08/10/24 1250 08/10/24 1609 08/10/24 1745 08/10/24 2036 08/11/24 0612 08/12/24 0404  HGB  --   --   --  17.6*  --   --   --   --  16.1 15.5  HCT  --   --   --  51.5  --   --   --   --  48.5 46.3  PLT  --   --   --  255  --   --   --   --  241 236  APTT  --  26  --   --   --   --   --   --   --   --   LABPROT  --  13.2  --   --   --   --   --   --   --   --   INR  --  1.0  --   --   --   --   --   --   --   --   HEPARINUNFRC  --   --    < > 0.12*   < >  --   --  0.Brian 0.68 0.60  CREATININE  --   --   --  0.51*  --   --   --   --  0.65 0.68  TROPONINIHS 5  --   --   --   --  6 6  --   --   --    < > = values in this interval not displayed.    Estimated Creatinine Clearance: 135 mL/min (by C-G formula based on SCr of 0.68 mg/dL).   Medical History: Past Medical History:  Diagnosis Date   Hypertension    Kidney stones     Medications:  Not on anticoagulation at home per chart review  Assessment: Patient is a 63 year old Brian [name] with a past medical history of hypertension, and cholecystectomy who presented to the ED with right-sided pain. Patient has elevated D-dimer of 1.25 and CT chest shows small burden pulmonary emboli in the right middle and right lower lobes. Pharmacy was consulted to initiate patient on a heparin  infusion.  Baseline INR and aPTT ordered. Hgb 18.6. PLT 292. No signs/symptoms of bleeding noted in chart.   Date Time Results Comments 9/22 1614 HL=0.35 Therapeutic x 1, rate 1750 units/hr (done early) 9/22     2153    HL=0.21           SUBtherapeutic  9/23     0509   HL=0.12           SUBtherapeutic  9/23 1250 HL=0.70 Therapeutic x 1, rate 2350 units/hr 9/23 2036 HL = 0.Brian Therapeutic x2 9/24 0612 HL = 0.68 Therapeutic 9/25     0404   HL =  0.60       Therapeutic X 4   Goal of Therapy:  Heparin  level 0.3-0.7 units/ml Monitor platelets by anticoagulation protocol: Yes   Plan:  Continue heparin  infusion at 2350 units/hr Recheck HL in AM Monitor CBC daily  while on heparin  Monitor for appropriate transition to oral anticoagulation   Thank you for involving pharmacy in this patient's care.   Casondra Gasca D Clinical Pharmacist 08/12/2024 4:48 AM

## 2024-08-12 NOTE — Discharge Summary (Signed)
 [facility] Discharge Summary   Patient: Brian [name] FMW:[id number]  PCP: Sadie Manna, MD  Date of admission: 08/09/2024   Date of discharge:  08/12/2024     Discharge Diagnoses:  Principal Problem:   Pulmonary emboli [facility]) Active Problems:   Acute pulmonary embolism (HCC)   Abdominal pain   Abdominal pain, chronic, epigastric   Abnormal CT of the abdomen   Admitted From: Hom Disposition:  Home   Recommendations for Outpatient Follow-up:  F/u with PCP in 1 wk F/u with GI in 1-2 wks F/u with Hematologist in 1-2 weeks Monitor BP at home and resume meds if SBP >130 mmHg Follow up LABS/TEST:  as above   Follow-up Information     Babara Call, MD Follow up in 1 month(s).   Specialty: Oncology Why: Unprovoked PE Contact information: [address] [place] [address] [phone]         Sadie Manna, MD Follow up in 1 week(s).   Specialty: Internal Medicine Contact information: [address] [place] [address] [phone]         Onita Elspeth Sharper, DO Follow up in 1 week(s).   Specialty: Gastroenterology Contact information: [address] [facility] [place] [address] [phone]                Diet recommendation: Cardiac diet  Activity: The patient is advised to gradually reintroduce usual activities, as tolerated  Discharge Condition: stable  Code Status: Full code   History of present illness: As per the H and P dictated on admission  Hospital Course:  Brian [name] is a 63 y.o. male with medical history significant of HTN, alcohol abuse, morbid obesity, presented with worsening abdominal pain and concern for chest pain.   Symptoms started about 1 month ago, patient started to have intermittent epigastric abdominal pain, sharp-like, nonradiating, usually happens in early mornings, abdominal pain can be relieved by taking early breakfast and sometimes ibuprofen  and  patient has been taking ibuprofen  almost every day for the last 2 to 3 weeks in the morning.  He drinks 1 cocktail glass of liquor every night.  Last 3 days, he started develop sharp like right-sided chest pain, intermittent, worsening with cough.  Denies any fever or chills. ED Course: Afebrile, no tachycardia blood pressure 150/70 O2 saturation 100% on room air.  Blood work showed WBC 10.1 hemoglobin 18.6 BUN 8 creatinine 0.5 sodium 135 potassium 3.2.   CT abdominal pelvis that showed inflammation of duodenum versus duodenum diverticulitis versus contained perforation of duodenum.  Film was reviewed by surgeon and GI, both conclusion there is left IV perforation, likely more representative duodenum ulcer secondary to NSAIDs and alcohol use.   CTA chest showed possible right middle and lower lobe subsegmental PE, low blood clot burden, no signs of right heart strain.  Troponin negative x 2.   Patient was admitted to the hospital, started on IV heparin .  GI and Surgery consulted in the ED for input on duodenal findings on CT.  Due to acute VTE and need for anticoagulation, conservative management for presumed PUD with PPI was recommended.     Further hospital course and management as outlined below.     Assessment and Plan:  # Acute right-sided PE, unprovoked - small burden, no right-heart strain seen on CTA # Left Lower Extremity DVT - small volume DVT seen in left calf veins. S/p IV heparin  infusion.  H&H remained stable.  No abdominal pain, tolerated diet  well.  No surgical intervention needed. Transition to Eliquis  10 mg p.o. twice daily followed by 5 mg p.o. twice daily. Echo LVEF 55 to 60%, mild LV hypertrophy, no any other significant findings. -Outpatient referral on d/c to hematology for hypercoagulable workup Stable for discharge.   # Acute epigastric pain # Duodenal wall thickening on imaging. Contained perforated duodenum ulcer suspected. Clinically suspect acute on ulcer given  the fact that patient's symptoms involving empty stomach pain, happens in the mornings relieved by eating meals, on top of it, patient uses alcohol and NSAIDs almost every day for at least 3 to 4 weeks further increased risk. - Case was discussed on admission with surgeon Dr. Tye at bedside, who went through the imaging study, recommend no surgical intervention, continue PPI, GI rest, IV fluids. - GI was consulted in the ED - if any signs of GI bleeding, may need urgent EGD.  Otherwise hold any intervention with acute need for anticoagulation.  Patient tolerated diet well, denied any abdominal pain.  H&H remained stable.  Stable to discharge.  Transition to oral PPI.  Pantoprazole  40 mg p.o. twice daily for 8 weeks followed by 40 mg p.o. daily and recommended to follow-up with GI as an outpatient.  # Hypokalemia: Potassium repleted and resolved # Hypomagnesemia, mag repleted.  Resolved # Hyponatremia - Na 131 >> 133 >132 stable.  Follow-up with PCP to repeat BMP after 1 to 2 weeks  # Alcohol use: Abstinence counseling done. No symptoms or signs of active withdrawal. S/p CIWA protocol with as needed benzos   # Folic acid  level 7.0, at lower end, started folic acid  supplement to prevent deficiency  # Hypertension: BP was soft during hospital stay so lisinopril and hydrochlorothiazide were held.  Resumed meds on discharge, patient was advised to monitor BP at home and if systolic BP greater than 130 mmHg then resume home medications.  And follow-up with PCP   # Obesity class II Body mass index is 36.59 kg/m.  Interventions:Calorie restricted diet and daily exercise advised to lose body weight.  Lifestyle modification discussed.  - Patient was instructed, not to drive, operate heavy machinery, perform activities at heights, swimming or participation in water activities or provide baby sitting services while on Pain, Sleep and Anxiety Medications; until his outpatient Physician has advised to do so  again.  - Also recommended to not to take more than prescribed Pain, Sleep and Anxiety Medications.  Patient was ambulatory without any assistance. On the day of the discharge the patient's vitals were stable, and no other acute medical condition were reported by patient. the patient was felt safe to be discharge at Home.  Consultants: GI and general surgery Procedures: None  Discharge Exam: General: Appear in no distress, Oral Mucosa Clear, moist. Cardiovascular: S1 and S2 Present, no Murmur, Respiratory: normal respiratory effort, Bilateral Air entry present and no Crackles, no wheezes Abdomen: Bowel Sound present, Soft and no tenderness. Extremities: no Pedal edema, no calf tenderness Neurology: alert and oriented to time, place, and person affect appropriate.  Filed Weights   08/09/24 1101  Weight: 129.3 kg   Vitals:   08/12/24 0411 08/12/24 0814  BP: 128/71 128/71  Pulse: (!) 54 (!) 59  Resp: 20 18  Temp: 97.6 F (36.4 C) 97.7 F (36.5 C)  SpO2: 99% 100%    DISCHARGE MEDICATION: Allergies as of 08/12/2024   No Known Allergies      Medication List     STOP taking these medications  cholestyramine 4 g packet Commonly known as: QUESTRAN   PARoxetine  40 MG tablet Commonly known as: PAXIL        TAKE these medications    Apixaban  Starter Pack (10mg  and 5mg ) Commonly known as: ELIQUIS  STARTER PACK Take as directed on package: start with two-5mg  tablets twice daily for 7 days. On day 8, switch to one-5mg  tablet twice daily.   escitalopram  10 MG tablet Commonly known as: LEXAPRO  Take 10 mg by mouth daily.   folic acid  1 MG tablet Commonly known as: FOLVITE  Take 1 tablet (1 mg total) by mouth daily. Start taking on: August 13, 2024   hydrochlorothiazide 12.5 MG tablet Commonly known as: HYDRODIURIL Take 12.5 mg by mouth daily.   linaclotide  145 MCG Caps capsule Commonly known as: LINZESS  Take 145 mcg by mouth daily as needed.   lisinopril 40  MG tablet Commonly known as: ZESTRIL Take 40 mg by mouth daily.   multivitamin with minerals Tabs tablet Take 1 tablet by mouth daily. Start taking on: August 13, 2024   pantoprazole  40 MG tablet Commonly known as: Protonix  Take 1 tablet (40 mg total) by mouth 2 (two) times daily for 60 days, THEN 1 tablet (40 mg total) daily. Start taking on: August 12, 2024   thiamine  100 MG tablet Commonly known as: Vitamin B-1 Take 1 tablet (100 mg total) by mouth daily. Start taking on: August 13, 2024   Vitamin D  (Ergocalciferol ) 1.25 MG (50000 UNIT) Caps capsule Commonly known as: DRISDOL Take 50,000 Units by mouth every 7 (seven) days. Monday       No Known Allergies Discharge Instructions     Call MD for:   Complete by: As directed    Bleeding or bruises   Call MD for:  difficulty breathing, headache or visual disturbances   Complete by: As directed    Call MD for:  extreme fatigue   Complete by: As directed    Call MD for:  persistant dizziness or light-headedness   Complete by: As directed    Call MD for:  persistant nausea and vomiting   Complete by: As directed    Call MD for:  severe uncontrolled pain   Complete by: As directed    Call MD for:  temperature >100.4   Complete by: As directed    Diet - low sodium heart healthy   Complete by: As directed    Discharge instructions   Complete by: As directed    F/u with PCP in 1 wk F/u with GI in 1-2 wks F/u with Hematologist in 1-2 weeks Monitor BP at home and resume meds if SBP >130 mmHg   Increase activity slowly   Complete by: As directed        The results of significant diagnostics from this hospitalization (including imaging, microbiology, ancillary and laboratory) are listed below for reference.    Significant Diagnostic Studies: ECHOCARDIOGRAM COMPLETE Result Date: 08/11/2024    ECHOCARDIOGRAM REPORT   Patient Name:   Brian [name] Date of Exam: 08/11/2024 Medical Rec #:  [id number]   Height:        74.0 in Accession #:    [id number]  Weight:       285.0 lb Date of Birth:  20-Mar-1961   BSA:          2.527 m Patient Age:    63 years    BP:           132/70 mmHg Patient Gender: M  HR:           62 bpm. Exam Location:  [facility] Procedure: 2D Echo, Cardiac Doppler and Color Doppler (Both Spectral and Color            Flow Doppler were utilized during procedure). Indications:     Pulmonary embolus I26.09  History:         Patient has no prior history of Echocardiogram examinations.                  Risk Factors:Hypertension.  Sonographer:     Christopher Furnace Referring Phys:  [id number] CORT ONEIDA MANA Diagnosing Phys: Keller Paterson  Sonographer Comments: Technically challenging study due to limited acoustic windows and no apical window. IMPRESSIONS  1. Technically difficult study, no apical window.  2. Left ventricular ejection fraction, by estimation, is 55 to 60%. The left ventricle has normal function. Left ventricular endocardial border not optimally defined to evaluate regional wall motion. There is mild left ventricular hypertrophy. Left ventricular diastolic parameters are indeterminate.  3. Right ventricular systolic function is normal. The right ventricular size is normal.  4. The mitral valve is normal in structure. Trivial mitral valve regurgitation.  5. The aortic valve was not well visualized. Aortic valve regurgitation is not visualized.  6. The inferior vena cava is normal in size with greater than 50% respiratory variability, suggesting right atrial pressure of 3 mmHg. FINDINGS  Left Ventricle: Left ventricular ejection fraction, by estimation, is 55 to 60%. The left ventricle has normal function. Left ventricular endocardial border not optimally defined to evaluate regional wall motion. The left ventricular internal cavity size was normal in size. There is mild left ventricular hypertrophy. Left ventricular diastolic parameters are indeterminate. Right Ventricle: The right ventricular size is normal. No  increase in right ventricular wall thickness. Right ventricular systolic function is normal. Left Atrium: Left atrial size was not well visualized. Right Atrium: Right atrial size was not well visualized. Pericardium: There is no evidence of pericardial effusion. Mitral Valve: The mitral valve is normal in structure. Trivial mitral valve regurgitation. Tricuspid Valve: The tricuspid valve is not well visualized. Tricuspid valve regurgitation is trivial. Aortic Valve: The aortic valve was not well visualized. Aortic valve regurgitation is not visualized. Pulmonic Valve: The pulmonic valve was not well visualized. Pulmonic valve regurgitation is not visualized. Aorta: The aortic root is normal in size and structure. Venous: The inferior vena cava is normal in size with greater than 50% respiratory variability, suggesting right atrial pressure of 3 mmHg. IAS/Shunts: The interatrial septum was not well visualized.  LEFT VENTRICLE PLAX 2D LVIDd:         4.00 cm LVIDs:         2.89 cm LV PW:         1.72 cm LV IVS:        1.40 cm LVOT diam:     2.20 cm LVOT Area:     3.80 cm  LEFT ATRIUM         Index LA diam:    4.40 cm 1.74 cm/m   AORTA Ao Root diam: 3.60 cm  SHUNTS Systemic Diam: 2.20 cm Keller Paterson Electronically signed by Keller Paterson Signature Date/Time: 08/11/2024/12:38:30 PM    Final    US  Venous Img Lower Bilateral (DVT) Result Date: 08/09/2024 CLINICAL DATA:  Acute pulmonary embolism, assess for residual DVT EXAM: BILATERAL LOWER EXTREMITY VENOUS DOPPLER ULTRASOUND TECHNIQUE: Gray-scale sonography with graded compression, as well as color Doppler and duplex ultrasound were  performed to evaluate the lower extremity deep venous systems from the level of the common femoral vein and including the common femoral, femoral, profunda femoral, popliteal and calf veins including the posterior tibial, peroneal and gastrocnemius veins when visible. The superficial great saphenous vein was also interrogated.  Spectral Doppler was utilized to evaluate flow at rest and with distal augmentation maneuvers in the common femoral, femoral and popliteal veins. COMPARISON:  None Available. FINDINGS: RIGHT LOWER EXTREMITY Common Femoral Vein: No evidence of thrombus. Normal compressibility, respiratory phasicity and response to augmentation. Saphenofemoral Junction: No evidence of thrombus. Normal compressibility and flow on color Doppler imaging. Profunda Femoral Vein: No evidence of thrombus. Normal compressibility and flow on color Doppler imaging. Femoral Vein: No evidence of thrombus. Normal compressibility, respiratory phasicity and response to augmentation. Popliteal Vein: No evidence of thrombus. Normal compressibility, respiratory phasicity and response to augmentation. Calf Veins: No evidence of thrombus. Normal compressibility and flow on color Doppler imaging. Superficial Great Saphenous Vein: No evidence of thrombus. Normal compressibility. Venous Reflux:  None. Other Findings:  None. LEFT LOWER EXTREMITY Common Femoral Vein: No evidence of thrombus. Normal compressibility, respiratory phasicity and response to augmentation. Saphenofemoral Junction: No evidence of thrombus. Normal compressibility and flow on color Doppler imaging. Profunda Femoral Vein: No evidence of thrombus. Normal compressibility and flow on color Doppler imaging. Femoral Vein: No evidence of thrombus. Normal compressibility, respiratory phasicity and response to augmentation. Popliteal Vein: No evidence of thrombus. Normal compressibility, respiratory phasicity and response to augmentation. Calf Veins: Non opacification of 1 of the posterior tibial veins and the peroneal veins consistent with residual DVT. Superficial Great Saphenous Vein: No evidence of thrombus. Normal compressibility. Venous Reflux:  None. Other Findings:  None. IMPRESSION: 1. Positive for small volume DVT within the left calf veins. 2. No evidence of DVT in the right lower  extremity. Electronically Signed   By: Wilkie Lent M.D.   On: 08/09/2024 16:24   CT ABDOMEN PELVIS W CONTRAST Result Date: 08/09/2024 CLINICAL DATA:  RUQ pain, hx cholecystectomy EXAM: CT ABDOMEN AND PELVIS WITH CONTRAST TECHNIQUE: Multidetector CT imaging of the abdomen and pelvis was performed using the standard protocol following bolus administration of intravenous contrast. RADIATION DOSE REDUCTION: This exam was performed according to the departmental dose-optimization program which includes automated exposure control, adjustment of the mA and/or kV according to patient size and/or use of iterative reconstruction technique. CONTRAST:  100mL OMNIPAQUE  IOHEXOL  350 MG/ML SOLN COMPARISON:  None available. FINDINGS: Lower chest: For findings above the diaphragm, please see the separately dictated CT of the chest report, which was performed concurrently. Hepatobiliary: No mass.Cholecystectomy no intrahepatic or extrahepatic biliary ductal dilation. The portal veins are patent. Pancreas: No mass or main ductal dilation. No peripancreatic inflammation or fluid collection. Spleen: Normal size. No mass. Adrenals/Urinary Tract: No adrenal masses. 2 cm cyst in the left kidney. No nephrolithiasis or hydronephrosis. The urinary bladder is distended without focal abnormality. Stomach/Bowel: The stomach contains ingested material without focal abnormality. Moderate wall thickening and inflammation surrounding the first and second portions of the duodenum. 3.1 cm outpouching along the first portion of the duodenum (axial 30). There is a second small outpouching along the posterior aspect of the second portion of the duodenum measuring 1.1 cm (sagittal 74).No small bowel obstruction.Normal appendix. Descending and sigmoid colonic diverticulosis. No changes of acute diverticulitis. Vascular/Lymphatic: No aortic aneurysm. No intraabdominal or pelvic lymphadenopathy. Reproductive: No prostatomegaly.No free pelvic fluid.  Other: No pneumoperitoneum or ascites. Musculoskeletal: No acute fracture or destructive lesion.  Multilevel degenerative disc disease of the spine. IMPRESSION: Moderate wall thickening inflammation surrounding the first and second portions of the duodenum. A couple of outpouchings noted medially in the first and second portions, measuring up to 3.1 cm (axial 30). Differential considerations include a couple of duodenum ulcers, duodenum diverticulitis, or even duodenitis with contained perforation. No pneumoperitoneum. Surgical and GI consultation recommended. Critical Value/emergent results were called by telephone at the time of interpretation on 08/09/2024 at 10:46 am to provider NILSA DADE MD , who verbally acknowledged these results. Electronically Signed   By: Rogelia Myers M.D.   On: 08/09/2024 11:05   CT Angio Chest PE W and/or Wo Contrast Result Date: 08/09/2024 CLINICAL DATA:  Pulmonary embolism (PE) suspected, high prob EXAM: CT ANGIOGRAPHY CHEST WITH CONTRAST TECHNIQUE: Multidetector CT imaging of the chest was performed using the standard protocol during bolus administration of intravenous contrast. Multiplanar CT image reconstructions and MIPs were obtained to evaluate the vascular anatomy. RADIATION DOSE REDUCTION: This exam was performed according to the departmental dose-optimization program which includes automated exposure control, adjustment of the mA and/or kV according to patient size and/or use of iterative reconstruction technique. CONTRAST:  OMNIPAQUE  IOHEXOL  350 MG/ML SOLN COMPARISON:  08/09/2024 FINDINGS: Pulmonary Embolism: Distal segmental/subsegmental embolus within the right lower lobe. There is also a proximal segmental pulmonary embolus within the right middle lobe. No findings of right heart strain. Cardiovascular: No cardiomegaly or pericardial effusion.No aortic aneurysm. Mediastinum/Nodes: No mediastinal mass.No mediastinal, hilar, or axillary lymphadenopathy.  Lungs/Pleura: The midline trachea and bronchi are patent. Posterior bibasilar dependent atelectasis. No focal airspace consolidation, pleural effusion, or pneumothorax. Musculoskeletal: No acute fracture or destructive bone lesion. Multilevel degenerative disc disease of the spine. Upper Abdomen: For findings below the diaphragm, please see the separately dictated CT of the abdomen and pelvis report, which was performed concurrently. Review of the MIP images confirms the above findings. IMPRESSION: 1. Small burden pulmonary emboli in the right middle and right lower lobes, as delineated above. No findings of right heart strain. 2. No pneumonia, pulmonary edema, or pleural effusion. 3. For findings below the diaphragm, please see the separately dictated CT of the abdomen and pelvis report, which was performed concurrently. Critical Value/emergent results were called by telephone at the time of interpretation on 08/09/2024 at 10:46 am to provider NILSA DADE MD , who verbally acknowledged these results. Electronically Signed   By: Rogelia Myers M.D.   On: 08/09/2024 10:51   DG Chest Port 1 View Result Date: 08/09/2024 EXAM: 1 VIEW XRAY OF THE CHEST 08/09/2024 07:29:00 AM COMPARISON: 09/29/2006 CLINICAL HISTORY: Pt reports having upper abd pain and right sided CP that has been intermittent all weekend. Pt reports hx of esophageal surgery and gallbladder removal. Denies cardiac hx. Pain 8/10. FINDINGS: LUNGS AND PLEURA: No focal pulmonary opacity. No pulmonary edema. No pleural effusion. No pneumothorax. HEART AND MEDIASTINUM: No acute abnormality of the cardiac and mediastinal silhouettes. BONES AND SOFT TISSUES: No acute osseous abnormality. IMPRESSION: 1. Normal chest radiograph. Electronically signed by: Waddell Calk MD 08/09/2024 07:37 AM EDT RP Workstation: [id number]    Microbiology: No results found for this or any previous visit (from the past 240 hours).   Labs: CBC: Recent Labs  Lab 08/09/24 0738  08/10/24 0509 08/11/24 0612 08/12/24 0404  WBC 10.1 7.8 7.1 8.5  HGB 18.6* 17.6* 16.1 15.5  HCT 52.9* 51.5 48.5 46.3  MCV 91.7 94.5 97.6 96.3  PLT 292 255 241 236   Basic Metabolic Panel: Recent Labs  Lab 08/09/24 0738 08/09/24 1614 08/10/24 0509 08/11/24 0612 08/12/24 0404  NA 131*  --  133* 134* 132*  K 3.2*  --  3.2* 4.3 4.1  CL 89*  --  94* 96* 96*  CO2 26  --  29 25 28   GLUCOSE 113*  --  88 85 88  BUN 8  --  10 14 13   CREATININE 0.56*  --  0.51* 0.65 0.68  CALCIUM 8.9  --  8.4* 8.3* 8.0*  MG  --  1.9  --  1.5* 2.1  PHOS  --   --   --  2.5 2.5   Liver Function Tests: Recent Labs  Lab 08/09/24 0738 08/11/24 0612  AST 19 13*  ALT 12 9  ALKPHOS 63 48  BILITOT 1.1 1.3*  PROT 6.7 5.4*  ALBUMIN 3.3* 2.6*   Recent Labs  Lab 08/09/24 0738 08/11/24 0612  LIPASE 61* 39   No results for input(s): AMMONIA in the last 168 hours. Cardiac Enzymes: No results for input(s): CKTOTAL, CKMB, CKMBINDEX, TROPONINI in the last 168 hours. BNP (last 3 results) No results for input(s): BNP in the last 8760 hours. CBG: No results for input(s): GLUCAP in the last 168 hours.  Time spent: 35 minutes  Signed:  Elvan Sor  [facility] 08/12/2024 11:07 AM

## 2024-08-12 NOTE — Telephone Encounter (Signed)
 Patient Product/process development scientist completed.    The patient is insured through [facility]. Patient has [employer], may use a copay card, and/or apply for patient assistance if available.    Ran test claim for Eliquis  Starter pack and the current 30 day co-pay is $25.00.   This test claim was processed through [facility]- copay amounts may vary at other pharmacies due to pharmacy/plan contracts, or as the patient moves through the different stages of their insurance plan.     Reyes Sharps, CPHT Pharmacy Technician III Certified Patient Advocate [facility] Patient Advocate Team Direct Number: [phone]  Fax: [phone]

## 2024-08-24 ENCOUNTER — Other Ambulatory Visit: Payer: PRIVATE HEALTH INSURANCE

## 2024-08-24 ENCOUNTER — Inpatient Hospital Stay: Payer: PRIVATE HEALTH INSURANCE | Attending: Oncology | Admitting: Oncology

## 2024-08-24 ENCOUNTER — Encounter: Payer: Self-pay | Admitting: Oncology

## 2024-08-24 VITALS — BP 130/81 | HR 88 | Temp 99.1°F | Resp 18 | Wt 285.4 lb

## 2024-08-24 DIAGNOSIS — I824Y2 Acute embolism and thrombosis of unspecified deep veins of left proximal lower extremity: Secondary | ICD-10-CM | POA: Insufficient documentation

## 2024-08-24 DIAGNOSIS — Z823 Family history of stroke: Secondary | ICD-10-CM | POA: Insufficient documentation

## 2024-08-24 DIAGNOSIS — Z801 Family history of malignant neoplasm of trachea, bronchus and lung: Secondary | ICD-10-CM | POA: Diagnosis not present

## 2024-08-24 DIAGNOSIS — I2699 Other pulmonary embolism without acute cor pulmonale: Secondary | ICD-10-CM | POA: Diagnosis not present

## 2024-08-24 DIAGNOSIS — Z800 Family history of malignant neoplasm of digestive organs: Secondary | ICD-10-CM | POA: Insufficient documentation

## 2024-08-24 DIAGNOSIS — I10 Essential (primary) hypertension: Secondary | ICD-10-CM | POA: Diagnosis not present

## 2024-08-24 DIAGNOSIS — Z7901 Long term (current) use of anticoagulants: Secondary | ICD-10-CM | POA: Diagnosis not present

## 2024-08-24 DIAGNOSIS — Z79899 Other long term (current) drug therapy: Secondary | ICD-10-CM | POA: Diagnosis not present

## 2024-08-24 MED ORDER — APIXABAN 5 MG PO TABS: 5.0000 mg | ORAL_TABLET | Freq: Two times a day (BID) | ORAL | 1 refills | Status: AC

## 2024-08-24 NOTE — Assessment & Plan Note (Addendum)
 Unprovoked pulmonary embolism, right middle and lower lobes, and acute deep vein thrombosis of left lower extremity -small clot burden  - Continue Eliquis  5 mg twice daily. - Order D-dimer test. -  Hypercoagulable workup planned post-acute phase. - Discuss potential transition to Eliquis  2.5 mg twice daily after three months. - consider repeat CT or ultrasound if D Dimer test results are positive.

## 2024-08-24 NOTE — Progress Notes (Signed)
 Hematology/Oncology Consult note [phone] [phone]        REFERRING PROVIDER: Von Bellis, MD   CHIEF COMPLAINTS/REASON FOR VISIT:  Evaluation of pulmonary embolism   ASSESSMENT & PLAN:   Acute pulmonary embolism (HCC) Unprovoked pulmonary embolism, right middle and lower lobes, and acute deep vein thrombosis of left lower extremity -small clot burden  - Continue Eliquis  5 mg twice daily. - Order D-dimer test. -  Hypercoagulable workup planned post-acute phase. - Discuss potential transition to Eliquis  2.5 mg twice daily after three months. - consider repeat CT or ultrasound if D Dimer test results are positive.  Acute deep vein thrombosis (DVT) of proximal vein of left lower extremity (HCC) See above workup and management.   Orders Placed This Encounter  Procedures   Factor 5 leiden    Standing Status:   Future    Expected Date:   08/24/2024    Expiration Date:   11/22/2024   Protein S, total and free    Standing Status:   Future    Expected Date:   08/24/2024    Expiration Date:   11/22/2024   Prothrombin gene mutation    Standing Status:   Future    Expected Date:   08/24/2024    Expiration Date:   11/22/2024   PNH Profile (-High Sensitivity)    Standing Status:   Future    Expected Date:   08/24/2024    Expiration Date:   11/22/2024   Protein C activity    Standing Status:   Future    Expected Date:   08/24/2024    Expiration Date:   11/22/2024   Beta-2-glycoprotein i abs, IgG/M/A    Standing Status:   Future    Expected Date:   08/24/2024    Expiration Date:   11/22/2024   Antithrombin III    Standing Status:   Future    Expected Date:   08/24/2024    Expiration Date:   11/22/2024   ANTIPHOSPHOLIPID SYNDROME PROF    Standing Status:   Future    Expected Date:   08/24/2024    Expiration Date:   11/22/2024   CBC (Cancer Center Only)    Standing Status:   Future    Expected Date:   08/24/2024    Expiration Date:   11/22/2024   Comprehensive  metabolic panel with GFR    Standing Status:   Future    Expected Date:   08/24/2024    Expiration Date:   11/22/2024   D-dimer, quantitative    Standing Status:   Future    Expected Date:   08/24/2024    Expiration Date:   08/24/2025   Follow-up in December. All questions were answered. The patient knows to call the clinic with any problems, questions or concerns.  Zelphia Cap, MD, PhD [facility] Hematology Oncology 08/24/2024   HISTORY OF PRESENTING ILLNESS:   Brian [name] is a  63 y.o.  male with PMH listed below was seen in consultation at the request of  Von Bellis, MD  for evaluation of pulmonary embolism  Discussed the use of AI scribe software for clinical note transcription with the patient, who gave verbal consent to proceed.   He has been experiencing severe pain in the epigastric area and right chest for several weeks prior to his hospital visit on August 09, 2024. The pain was constant and severe enough to prompt an emergency room visit. No shortness of breath or pain on deep breathing.  During the hospital stay, a CT angiogram revealed Small burden pulmonary emboli in the right middle and right lower lobes. A CT of the abdomen showed duodenum wall thickening. He has a colonoscopy and endoscopy scheduled for November 15, 2024. Bilateral lower extremity ultrasound showed small volume DVT within the left calf veins.  No DVT in the right lower extremity.  He was started on anticoagulation therapy with Eliquis , initially at 10 mg twice daily for a week, now reduced to 5 mg twice daily. No bleeding but has noticed easy bruising. Significant improvement in his right-sided chest and abdominal pain.  He denies any recent travel, immobilization, or surgeries that could have provoked the blood clots. No history of smoking and no known family history of blood clots. Family history includes an uncle with colon cancer and a father with lung cancer.  He had a previous colonoscopy last  year which showed tubular adenoma, and a benign mass was removed a few years ago. He is up to date with cancer screenings.  Today patient reports that right-sided chest pain has resolved.  No shortness of breath.  MEDICAL HISTORY:  Past Medical History:  Diagnosis Date   Hypertension    Kidney stones     SURGICAL HISTORY: Past Surgical History:  Procedure Laterality Date   CHOLECYSTECTOMY     Reflux sugery     TONSILLECTOMY      SOCIAL HISTORY: Social History   Socioeconomic History   Marital status: Married    Spouse name: Not on file   Number of children: Not on file   Years of education: Not on file   Highest education level: Not on file  Occupational History   Not on file  Tobacco Use   Smoking status: Never   Smokeless tobacco: Current    Types: Snuff  Substance and Sexual Activity   Alcohol use: Yes    Comment: a cocktail daily   Drug use: No   Sexual activity: Not on file  Other Topics Concern   Not on file  Social History Narrative   Not on file   Social Drivers of Health   Financial Resource Strain: Low Risk  (08/24/2024)   Overall Financial Resource Strain (CARDIA)    Difficulty of Paying Living Expenses: Not very hard  Food Insecurity: No Food Insecurity (08/24/2024)   Hunger Vital Sign    Worried About Running Out of Food in the Last Year: Never true    Ran Out of Food in the Last Year: Never true  Transportation Needs: No Transportation Needs (08/24/2024)   PRAPARE - Administrator, Civil Service (Medical): No    Lack of Transportation (Non-Medical): No  Physical Activity: Not on file  Stress: No Stress Concern Present (08/24/2024)   [employer] of Occupational Health - Occupational Stress Questionnaire    Feeling of Stress: Not at all  Social Connections: Socially Integrated (08/09/2024)   Social Connection and Isolation Panel    Frequency of Communication with Friends and Family: More than three times a week    Frequency of  Social Gatherings with Friends and Family: More than three times a week    Attends Religious Services: More than 4 times per year    Active Member of [employer] or Organizations: Yes    Attends Banker Meetings: More than 4 times per year    Marital Status: Married  Catering manager Violence: Not At Risk (08/24/2024)   Humiliation, Afraid, Rape, and Kick questionnaire  Fear of Current or Ex-Partner: No    Emotionally Abused: No    Physically Abused: No    Sexually Abused: No    FAMILY HISTORY: Family History  Problem Relation Age of Onset   Lung cancer Father    Stroke Paternal Grandmother    Colon cancer Paternal Uncle     ALLERGIES:  has no known allergies.  MEDICATIONS:  Current Outpatient Medications  Medication Sig Dispense Refill   [START ON 09/06/2024] apixaban  (ELIQUIS ) 5 MG TABS tablet Take 1 tablet (5 mg total) by mouth 2 (two) times daily. 60 tablet 1   APIXABAN  (ELIQUIS ) VTE STARTER PACK (10MG  AND 5MG ) Take as directed on package: start with two-5mg  tablets twice daily for 7 days. On day 8, switch to one-5mg  tablet twice daily. 74 each 0   escitalopram  (LEXAPRO ) 10 MG tablet Take 10 mg by mouth daily.     folic acid  (FOLVITE ) 1 MG tablet Take 1 tablet (1 mg total) by mouth daily. 30 tablet 2   hydrochlorothiazide (HYDRODIURIL) 12.5 MG tablet Take 12.5 mg by mouth daily.     linaclotide  (LINZESS ) 145 MCG CAPS capsule Take 145 mcg by mouth daily as needed.     lisinopril (PRINIVIL,ZESTRIL) 40 MG tablet Take 40 mg by mouth daily.     Multiple Vitamin (MULTIVITAMIN WITH MINERALS) TABS tablet Take 1 tablet by mouth daily. 30 tablet 0   pantoprazole  (PROTONIX ) 40 MG tablet Take 1 tablet (40 mg total) by mouth 2 (two) times daily for 60 days, THEN 1 tablet (40 mg total) daily. 30 tablet 1   thiamine  (VITAMIN B-1) 100 MG tablet Take 1 tablet (100 mg total) by mouth daily. 30 tablet 0   Vitamin D , Ergocalciferol , (DRISDOL) 1.25 MG (50000 UNIT) CAPS capsule Take 50,000  Units by mouth every 7 (seven) days. Monday     No current facility-administered medications for this visit.    Review of Systems  Constitutional:  Negative for appetite change, chills, fatigue and fever.  HENT:   Negative for hearing loss and voice change.   Eyes:  Negative for eye problems.  Respiratory:  Negative for chest tightness and cough.   Cardiovascular:  Negative for chest pain.  Gastrointestinal:  Negative for abdominal distention, abdominal pain and blood in stool.  Endocrine: Negative for hot flashes.  Genitourinary:  Negative for difficulty urinating and frequency.   Musculoskeletal:  Negative for arthralgias.  Skin:  Negative for itching and rash.  Neurological:  Negative for extremity weakness.  Hematological:  Negative for adenopathy.  Psychiatric/Behavioral:  Negative for confusion.    PHYSICAL EXAMINATION:  Vitals:   08/24/24 1456  BP: 130/81  Pulse: 88  Resp: 18  Temp: 99.1 F (37.3 C)  SpO2: 96%   Filed Weights   08/24/24 1456  Weight: 285 lb 6.4 oz (129.5 kg)    Physical Exam Constitutional:      General: He is not in acute distress. HENT:     Head: Normocephalic and atraumatic.  Eyes:     General: No scleral icterus. Cardiovascular:     Rate and Rhythm: Normal rate and regular rhythm.     Heart sounds: Normal heart sounds.  Pulmonary:     Effort: Pulmonary effort is normal. No respiratory distress.     Breath sounds: No wheezing.  Abdominal:     General: Bowel sounds are normal. There is no distension.     Palpations: Abdomen is soft.  Musculoskeletal:        General: No  deformity. Normal range of motion.     Cervical back: Normal range of motion and neck supple.  Skin:    General: Skin is warm and dry.     Findings: No erythema or rash.  Neurological:     Mental Status: He is alert and oriented to person, place, and time. Mental status is at baseline.     Cranial Nerves: No cranial nerve deficit.     Coordination: Coordination  normal.  Psychiatric:        Mood and Affect: Mood normal.     LABORATORY DATA:  I have reviewed the data as listed    Latest Ref Rng & Units 08/12/2024    4:04 AM 08/11/2024    6:12 AM 08/10/2024    5:09 AM  CBC  WBC 4.0 - 10.5 K/uL 8.5  7.1  7.8   Hemoglobin 13.0 - 17.0 g/dL 84.4  83.8  82.3   Hematocrit 39.0 - 52.0 % 46.3  48.5  51.5   Platelets 150 - 400 K/uL 236  241  255       Latest Ref Rng & Units 08/12/2024    4:04 AM 08/11/2024    6:12 AM 08/10/2024    5:09 AM  CMP  Glucose 70 - 99 mg/dL 88  85  88   BUN 8 - 23 mg/dL 13  14  10    Creatinine 0.61 - 1.24 mg/dL 9.31  9.34  9.48   Sodium 135 - 145 mmol/L 132  134  133   Potassium 3.5 - 5.1 mmol/L 4.1  4.3  3.2   Chloride 98 - 111 mmol/L 96  96  94   CO2 22 - 32 mmol/L 28  25  29    Calcium 8.9 - 10.3 mg/dL 8.0  8.3  8.4   Total Protein 6.5 - 8.1 g/dL  5.4    Total Bilirubin 0.0 - 1.2 mg/dL  1.3    Alkaline Phos 38 - 126 U/L  48    AST 15 - 41 U/L  13    ALT 0 - 44 U/L  9        RADIOGRAPHIC STUDIES: I have personally reviewed the radiological images as listed and agreed with the findings in the report. ECHOCARDIOGRAM COMPLETE Result Date: 08/11/2024    ECHOCARDIOGRAM REPORT   Patient Name:   ZETHAN ALFIERI Date of Exam: 08/11/2024 Medical Rec #:  [id number]   Height:       74.0 in Accession #:    [id number]  Weight:       285.0 lb Date of Birth:  1961-03-02   BSA:          2.527 m Patient Age:    63 years    BP:           132/70 mmHg Patient Gender: M           HR:           62 bpm. Exam Location:  [facility] Procedure: 2D Echo, Cardiac Doppler and Color Doppler (Both Spectral and Color            Flow Doppler were utilized during procedure). Indications:     Pulmonary embolus I26.09  History:         Patient has no prior history of Echocardiogram examinations.                  Risk Factors:Hypertension.  Sonographer:     Christopher Furnace Referring Phys:  [id number] [facility]  T ZHANG Diagnosing Phys: Keller Paterson  Sonographer Comments:  Technically challenging study due to limited acoustic windows and no apical window. IMPRESSIONS  1. Technically difficult study, no apical window.  2. Left ventricular ejection fraction, by estimation, is 55 to 60%. The left ventricle has normal function. Left ventricular endocardial border not optimally defined to evaluate regional wall motion. There is mild left ventricular hypertrophy. Left ventricular diastolic parameters are indeterminate.  3. Right ventricular systolic function is normal. The right ventricular size is normal.  4. The mitral valve is normal in structure. Trivial mitral valve regurgitation.  5. The aortic valve was not well visualized. Aortic valve regurgitation is not visualized.  6. The inferior vena cava is normal in size with greater than 50% respiratory variability, suggesting right atrial pressure of 3 mmHg. FINDINGS  Left Ventricle: Left ventricular ejection fraction, by estimation, is 55 to 60%. The left ventricle has normal function. Left ventricular endocardial border not optimally defined to evaluate regional wall motion. The left ventricular internal cavity size was normal in size. There is mild left ventricular hypertrophy. Left ventricular diastolic parameters are indeterminate. Right Ventricle: The right ventricular size is normal. No increase in right ventricular wall thickness. Right ventricular systolic function is normal. Left Atrium: Left atrial size was not well visualized. Right Atrium: Right atrial size was not well visualized. Pericardium: There is no evidence of pericardial effusion. Mitral Valve: The mitral valve is normal in structure. Trivial mitral valve regurgitation. Tricuspid Valve: The tricuspid valve is not well visualized. Tricuspid valve regurgitation is trivial. Aortic Valve: The aortic valve was not well visualized. Aortic valve regurgitation is not visualized. Pulmonic Valve: The pulmonic valve was not well visualized. Pulmonic valve regurgitation is not  visualized. Aorta: The aortic root is normal in size and structure. Venous: The inferior vena cava is normal in size with greater than 50% respiratory variability, suggesting right atrial pressure of 3 mmHg. IAS/Shunts: The interatrial septum was not well visualized.  LEFT VENTRICLE PLAX 2D LVIDd:         4.00 cm LVIDs:         2.89 cm LV PW:         1.72 cm LV IVS:        1.40 cm LVOT diam:     2.20 cm LVOT Area:     3.80 cm  LEFT ATRIUM         Index LA diam:    4.40 cm 1.74 cm/m   AORTA Ao Root diam: 3.60 cm  SHUNTS Systemic Diam: 2.20 cm Keller Paterson Electronically signed by Keller Paterson Signature Date/Time: 08/11/2024/12:38:30 PM    Final    US  Venous Img Lower Bilateral (DVT) Result Date: 08/09/2024 CLINICAL DATA:  Acute pulmonary embolism, assess for residual DVT EXAM: BILATERAL LOWER EXTREMITY VENOUS DOPPLER ULTRASOUND TECHNIQUE: Gray-scale sonography with graded compression, as well as color Doppler and duplex ultrasound were performed to evaluate the lower extremity deep venous systems from the level of the common femoral vein and including the common femoral, femoral, profunda femoral, popliteal and calf veins including the posterior tibial, peroneal and gastrocnemius veins when visible. The superficial great saphenous vein was also interrogated. Spectral Doppler was utilized to evaluate flow at rest and with distal augmentation maneuvers in the common femoral, femoral and popliteal veins. COMPARISON:  None Available. FINDINGS: RIGHT LOWER EXTREMITY Common Femoral Vein: No evidence of thrombus. Normal compressibility, respiratory phasicity and response to augmentation. Saphenofemoral Junction: No evidence of thrombus. Normal compressibility and flow on  color Doppler imaging. Profunda Femoral Vein: No evidence of thrombus. Normal compressibility and flow on color Doppler imaging. Femoral Vein: No evidence of thrombus. Normal compressibility, respiratory phasicity and response to augmentation.  Popliteal Vein: No evidence of thrombus. Normal compressibility, respiratory phasicity and response to augmentation. Calf Veins: No evidence of thrombus. Normal compressibility and flow on color Doppler imaging. Superficial Great Saphenous Vein: No evidence of thrombus. Normal compressibility. Venous Reflux:  None. Other Findings:  None. LEFT LOWER EXTREMITY Common Femoral Vein: No evidence of thrombus. Normal compressibility, respiratory phasicity and response to augmentation. Saphenofemoral Junction: No evidence of thrombus. Normal compressibility and flow on color Doppler imaging. Profunda Femoral Vein: No evidence of thrombus. Normal compressibility and flow on color Doppler imaging. Femoral Vein: No evidence of thrombus. Normal compressibility, respiratory phasicity and response to augmentation. Popliteal Vein: No evidence of thrombus. Normal compressibility, respiratory phasicity and response to augmentation. Calf Veins: Non opacification of 1 of the posterior tibial veins and the peroneal veins consistent with residual DVT. Superficial Great Saphenous Vein: No evidence of thrombus. Normal compressibility. Venous Reflux:  None. Other Findings:  None. IMPRESSION: 1. Positive for small volume DVT within the left calf veins. 2. No evidence of DVT in the right lower extremity. Electronically Signed   By: Wilkie Lent M.D.   On: 08/09/2024 16:24   CT ABDOMEN PELVIS W CONTRAST Result Date: 08/09/2024 CLINICAL DATA:  RUQ pain, hx cholecystectomy EXAM: CT ABDOMEN AND PELVIS WITH CONTRAST TECHNIQUE: Multidetector CT imaging of the abdomen and pelvis was performed using the standard protocol following bolus administration of intravenous contrast. RADIATION DOSE REDUCTION: This exam was performed according to the departmental dose-optimization program which includes automated exposure control, adjustment of the mA and/or kV according to patient size and/or use of iterative reconstruction technique. CONTRAST:   OMNIPAQUE  IOHEXOL  350 MG/ML SOLN COMPARISON:  None available. FINDINGS: Lower chest: For findings above the diaphragm, please see the separately dictated CT of the chest report, which was performed concurrently. Hepatobiliary: No mass.Cholecystectomy no intrahepatic or extrahepatic biliary ductal dilation. The portal veins are patent. Pancreas: No mass or main ductal dilation. No peripancreatic inflammation or fluid collection. Spleen: Normal size. No mass. Adrenals/Urinary Tract: No adrenal masses. 2 cm cyst in the left kidney. No nephrolithiasis or hydronephrosis. The urinary bladder is distended without focal abnormality. Stomach/Bowel: The stomach contains ingested material without focal abnormality. Moderate wall thickening and inflammation surrounding the first and second portions of the duodenum. 3.1 cm outpouching along the first portion of the duodenum (axial 30). There is a second small outpouching along the posterior aspect of the second portion of the duodenum measuring 1.1 cm (sagittal 74).No small bowel obstruction.Normal appendix. Descending and sigmoid colonic diverticulosis. No changes of acute diverticulitis. Vascular/Lymphatic: No aortic aneurysm. No intraabdominal or pelvic lymphadenopathy. Reproductive: No prostatomegaly.No free pelvic fluid. Other: No pneumoperitoneum or ascites. Musculoskeletal: No acute fracture or destructive lesion. Multilevel degenerative disc disease of the spine. IMPRESSION: Moderate wall thickening inflammation surrounding the first and second portions of the duodenum. A couple of outpouchings noted medially in the first and second portions, measuring up to 3.1 cm (axial 30). Differential considerations include a couple of duodenum ulcers, duodenum diverticulitis, or even duodenitis with contained perforation. No pneumoperitoneum. Surgical and GI consultation recommended. Critical Value/emergent results were called by telephone at the time of interpretation on  08/09/2024 at 10:46 am to provider NILSA DADE MD , who verbally acknowledged these results. Electronically Signed   By: Rogelia Myers M.D.   On:  08/09/2024 11:05   CT Angio Chest PE W and/or Wo Contrast Result Date: 08/09/2024 CLINICAL DATA:  Pulmonary embolism (PE) suspected, high prob EXAM: CT ANGIOGRAPHY CHEST WITH CONTRAST TECHNIQUE: Multidetector CT imaging of the chest was performed using the standard protocol during bolus administration of intravenous contrast. Multiplanar CT image reconstructions and MIPs were obtained to evaluate the vascular anatomy. RADIATION DOSE REDUCTION: This exam was performed according to the departmental dose-optimization program which includes automated exposure control, adjustment of the mA and/or kV according to patient size and/or use of iterative reconstruction technique. CONTRAST:  OMNIPAQUE  IOHEXOL  350 MG/ML SOLN COMPARISON:  08/09/2024 FINDINGS: Pulmonary Embolism: Distal segmental/subsegmental embolus within the right lower lobe. There is also a proximal segmental pulmonary embolus within the right middle lobe. No findings of right heart strain. Cardiovascular: No cardiomegaly or pericardial effusion.No aortic aneurysm. Mediastinum/Nodes: No mediastinal mass.No mediastinal, hilar, or axillary lymphadenopathy. Lungs/Pleura: The midline trachea and bronchi are patent. Posterior bibasilar dependent atelectasis. No focal airspace consolidation, pleural effusion, or pneumothorax. Musculoskeletal: No acute fracture or destructive bone lesion. Multilevel degenerative disc disease of the spine. Upper Abdomen: For findings below the diaphragm, please see the separately dictated CT of the abdomen and pelvis report, which was performed concurrently. Review of the MIP images confirms the above findings. IMPRESSION: 1. Small burden pulmonary emboli in the right middle and right lower lobes, as delineated above. No findings of right heart strain. 2. No pneumonia, pulmonary  edema, or pleural effusion. 3. For findings below the diaphragm, please see the separately dictated CT of the abdomen and pelvis report, which was performed concurrently. Critical Value/emergent results were called by telephone at the time of interpretation on 08/09/2024 at 10:46 am to provider NILSA DADE MD , who verbally acknowledged these results. Electronically Signed   By: Rogelia Myers M.D.   On: 08/09/2024 10:51   DG Chest Port 1 View Result Date: 08/09/2024 EXAM: 1 VIEW XRAY OF THE CHEST 08/09/2024 07:29:00 AM COMPARISON: 09/29/2006 CLINICAL HISTORY: Pt reports having upper abd pain and right sided CP that has been intermittent all weekend. Pt reports hx of esophageal surgery and gallbladder removal. Denies cardiac hx. Pain 8/10. FINDINGS: LUNGS AND PLEURA: No focal pulmonary opacity. No pulmonary edema. No pleural effusion. No pneumothorax. HEART AND MEDIASTINUM: No acute abnormality of the cardiac and mediastinal silhouettes. BONES AND SOFT TISSUES: No acute osseous abnormality. IMPRESSION: 1. Normal chest radiograph. Electronically signed by: Waddell Calk MD 08/09/2024 07:37 AM EDT RP Workstation: [id number]   CT ABDOMEN PELVIS W CONTRAST Result Date: 06/19/2024 EXAM: CT ABDOMEN AND PELVIS WITH CONTRAST 06/14/2024 11:26:50 AM TECHNIQUE: CT of the abdomen and pelvis was performed with the administration of intravenous contrast. Multiplanar reformatted images are provided for review. Automated exposure control, iterative reconstruction, and/or weight based adjustment of the mA/kV was utilized to reduce the radiation dose to as low as reasonably achievable. COMPARISON: CT abdomen and pelvis 12/31/2021. CLINICAL HISTORY: Pt c/o constipation with mid upper abdominal pain and nausea x 1 month. He states the pain is worse after having a BM. NKI No hx CA. Hx [employer] Fundoplication in 1996, Cholecystectomy and Tubulovillous adenoma/Hyperplastic polyp removed during SIGMOIDOSCOPY on 07/19/2022. FINDINGS: LOWER  CHEST: No acute abnormality. LIVER: Hepatic steatosis. GALLBLADDER AND BILE DUCTS: Cholecystectomy. No biliary ductal dilatation. SPLEEN: No acute abnormality. PANCREAS: No acute abnormality. ADRENAL GLANDS: No acute abnormality. KIDNEYS, URETERS AND BLADDER: Nonobstructing stones in the right kidney. No hydronephrosis. No perinephric or periureteral stranding. Urinary bladder is nondistended. GI AND BOWEL: Postoperative change  of fundoplication. Normal appendix. No bowel obstruction. No bowel wall thickening. PERITONEUM AND RETROPERITONEUM: No ascites. No free air. VASCULATURE: Aorta is normal in caliber. LYMPH NODES: No lymphadenopathy. REPRODUCTIVE ORGANS: No acute abnormality. BONES AND SOFT TISSUES: No acute osseous abnormality. No focal soft tissue abnormality. IMPRESSION: 1. No acute findings in the abdomen or pelvis. 2. Hepatic steatosis. 3. Nonobstructing right nephrolithiasis. Electronically signed by: Norman Gatlin MD 06/19/2024 08:45 PM EDT RP Workstation: [id number]

## 2024-08-24 NOTE — Assessment & Plan Note (Signed)
 See above workup and management.

## 2024-09-20 ENCOUNTER — Encounter: Payer: Self-pay | Admitting: Oncology

## 2024-09-21 ENCOUNTER — Telehealth: Payer: Self-pay | Admitting: Pharmacy Technician

## 2024-09-21 NOTE — Telephone Encounter (Signed)
 Spoke to pt and informed him that testing order is Hypercoagulable workup. He agreed to keep appt with MD to further discuss. Labwork has been cancelled per pt request.

## 2024-09-21 NOTE — Telephone Encounter (Signed)
 Provided patient with an estimate as to how much his labs would cost.  Patient inquiring as to why his provider ordered the labs.  Sent a message to his medical team asking them to call patient and explain why his labs were necessary.  Patient stated that he was going to cancel his lab work on 11/5 and contact his primary care provider to follow him for labs and his blood clots.  I also made his medical team aware of the patient's wishes.  Dickey DOROTHA Fritter Patient Pharmacologist [facility]

## 2024-09-22 ENCOUNTER — Inpatient Hospital Stay: Payer: PRIVATE HEALTH INSURANCE

## 2024-11-01 ENCOUNTER — Encounter: Payer: Self-pay | Admitting: Oncology

## 2024-11-02 ENCOUNTER — Inpatient Hospital Stay: Payer: PRIVATE HEALTH INSURANCE | Admitting: Oncology

## 2024-11-15 ENCOUNTER — Ambulatory Visit: Payer: PRIVATE HEALTH INSURANCE | Admitting: Anesthesiology

## 2024-11-15 ENCOUNTER — Ambulatory Visit
Admission: RE | Admit: 2024-11-15 | Discharge: 2024-11-15 | Disposition: A | Discharge status: Home / Self Care | Payer: PRIVATE HEALTH INSURANCE | Attending: Gastroenterology | Admitting: Gastroenterology

## 2024-11-15 ENCOUNTER — Other Ambulatory Visit: Payer: Self-pay

## 2024-11-15 ENCOUNTER — Encounter
Admission: RE | Disposition: A | Discharge status: Home / Self Care | Payer: Self-pay | Source: Home / Self Care | Attending: Gastroenterology

## 2024-11-15 ENCOUNTER — Encounter: Payer: Self-pay | Admitting: Gastroenterology

## 2024-11-15 DIAGNOSIS — E669 Obesity, unspecified: Secondary | ICD-10-CM | POA: Insufficient documentation

## 2024-11-15 DIAGNOSIS — K317 Polyp of stomach and duodenum: Secondary | ICD-10-CM | POA: Insufficient documentation

## 2024-11-15 DIAGNOSIS — D122 Benign neoplasm of ascending colon: Secondary | ICD-10-CM | POA: Insufficient documentation

## 2024-11-15 DIAGNOSIS — K635 Polyp of colon: Secondary | ICD-10-CM | POA: Diagnosis not present

## 2024-11-15 DIAGNOSIS — K5730 Diverticulosis of large intestine without perforation or abscess without bleeding: Secondary | ICD-10-CM | POA: Diagnosis not present

## 2024-11-15 DIAGNOSIS — I10 Essential (primary) hypertension: Secondary | ICD-10-CM | POA: Insufficient documentation

## 2024-11-15 DIAGNOSIS — Z86718 Personal history of other venous thrombosis and embolism: Secondary | ICD-10-CM | POA: Insufficient documentation

## 2024-11-15 DIAGNOSIS — K2270 Barrett's esophagus without dysplasia: Secondary | ICD-10-CM | POA: Insufficient documentation

## 2024-11-15 DIAGNOSIS — Z6836 Body mass index (BMI) 36.0-36.9, adult: Secondary | ICD-10-CM | POA: Insufficient documentation

## 2024-11-15 DIAGNOSIS — Z1211 Encounter for screening for malignant neoplasm of colon: Secondary | ICD-10-CM | POA: Insufficient documentation

## 2024-11-15 DIAGNOSIS — K648 Other hemorrhoids: Secondary | ICD-10-CM | POA: Insufficient documentation

## 2024-11-15 DIAGNOSIS — K2980 Duodenitis without bleeding: Secondary | ICD-10-CM | POA: Diagnosis not present

## 2024-11-15 DIAGNOSIS — K2950 Unspecified chronic gastritis without bleeding: Secondary | ICD-10-CM | POA: Diagnosis not present

## 2024-11-15 DIAGNOSIS — K31A19 Gastric intestinal metaplasia without dysplasia, unspecified site: Secondary | ICD-10-CM | POA: Insufficient documentation

## 2024-11-15 DIAGNOSIS — Z79899 Other long term (current) drug therapy: Secondary | ICD-10-CM | POA: Insufficient documentation

## 2024-11-15 DIAGNOSIS — Z87442 Personal history of urinary calculi: Secondary | ICD-10-CM | POA: Diagnosis not present

## 2024-11-15 DIAGNOSIS — K589 Irritable bowel syndrome without diarrhea: Secondary | ICD-10-CM | POA: Diagnosis not present

## 2024-11-15 DIAGNOSIS — K269 Duodenal ulcer, unspecified as acute or chronic, without hemorrhage or perforation: Secondary | ICD-10-CM | POA: Diagnosis not present

## 2024-11-15 DIAGNOSIS — D123 Benign neoplasm of transverse colon: Secondary | ICD-10-CM | POA: Diagnosis not present

## 2024-11-15 HISTORY — PX: POLYPECTOMY: SHX149

## 2024-11-15 HISTORY — PX: COLONOSCOPY: SHX5424

## 2024-11-15 HISTORY — PX: ESOPHAGOGASTRODUODENOSCOPY: SHX5428

## 2024-11-15 SURGERY — COLONOSCOPY
Anesthesia: General

## 2024-11-15 MED ORDER — PROPOFOL 500 MG/50ML IV EMUL
INTRAVENOUS | Status: DC | PRN
  Administered 2024-11-15: 150 ug/kg/min via INTRAVENOUS

## 2024-11-15 MED ORDER — LIDOCAINE HCL (PF) 2 % IJ SOLN
INTRAMUSCULAR | Status: AC
  Filled 2024-11-15: qty 5

## 2024-11-15 MED ORDER — LIDOCAINE HCL (CARDIAC) PF 100 MG/5ML IV SOSY
PREFILLED_SYRINGE | INTRAVENOUS | Status: DC | PRN
  Administered 2024-11-15: 100 mg via INTRAVENOUS

## 2024-11-15 MED ORDER — PROPOFOL 10 MG/ML IV BOLUS
INTRAVENOUS | Status: DC | PRN
  Administered 2024-11-15: 50 mg via INTRAVENOUS
  Administered 2024-11-15: 100 mg via INTRAVENOUS

## 2024-11-15 MED ORDER — PROPOFOL 1000 MG/100ML IV EMUL
INTRAVENOUS | Status: AC
  Filled 2024-11-15: qty 100

## 2024-11-15 MED ORDER — SODIUM CHLORIDE 0.9 % IV SOLN: INTRAVENOUS | Status: DC

## 2024-11-15 NOTE — Op Note (Signed)
 [facility] Gastroenterology Patient Name: Brian [name] Procedure Date: 11/15/2024 8:18 AM MRN: [id number] Account #: [id number] Date of Birth: [birth year] Admit Type: Outpatient Age: 63 Room: [facility] ENDO ROOM 2 Gender: Male Note Status: Finalized Instrument Name: Colon Scope [id number] Procedure:             Colonoscopy Indications:           High risk colon cancer surveillance: Personal history                         of adenoma with villous component Providers:             [name] DO, DO Referring MD:          Tamra Leventhal, MD (Referring MD) Medicines:             Monitored Anesthesia Care Complications:         No immediate complications. Estimated blood loss:                         Minimal. Procedure:             Pre-Anesthesia Assessment:                        - Prior to the procedure, a History and Physical was                         performed, and patient medications and allergies were                         reviewed. The patient is competent. The risks and                         benefits of the procedure and the sedation options and                         risks were discussed with the patient. All questions                         were answered and informed consent was obtained.                         Patient identification and proposed procedure were                         verified by the physician, the nurse, the anesthetist                         and the technician in the endoscopy suite. Mental                         Status Examination: alert and oriented. Airway                         Examination: normal oropharyngeal airway and neck                         mobility. Respiratory Examination: clear to  auscultation. CV Examination: RRR, no murmurs, no S3                         or S4. Prophylactic Antibiotics: The patient does not                         require prophylactic antibiotics. Prior                          Anticoagulants: The patient has taken no anticoagulant                         or antiplatelet agents. ASA Grade Assessment: III - A                         patient with severe systemic disease. After reviewing                         the risks and benefits, the patient was deemed in                         satisfactory condition to undergo the procedure. The                         anesthesia plan was to use monitored anesthesia care                         (MAC). Immediately prior to administration of                         medications, the patient was re-assessed for adequacy                         to receive sedatives. The heart rate, respiratory                         rate, oxygen saturations, blood pressure, adequacy of                         pulmonary ventilation, and response to care were                         monitored throughout the procedure. The physical                         status of the patient was re-assessed after the                         procedure.                        After obtaining informed consent, the colonoscope was                         passed under direct vision. Throughout the procedure,                         the patient's blood pressure, pulse, and oxygen  saturations were monitored continuously. The                         Colonoscope was introduced through the anus and                         advanced to the the terminal ileum, with                         identification of the appendiceal orifice and IC                         valve. The colonoscopy was somewhat difficult due to                         spasm. Successful completion of the procedure was                         aided by extended time/lavage/insufflation. The                         patient tolerated the procedure well. The quality of                         the bowel preparation was evaluated using the BBPS                         [facility] Bowel  Preparation Scale) with scores of: Right                         Colon = 2 (minor amount of residual staining, small                         fragments of stool and/or opaque liquid, but mucosa                         seen well), Transverse Colon = 2 (minor amount of                         residual staining, small fragments of stool and/or                         opaque liquid, but mucosa seen well) and Left Colon =                         2 (minor amount of residual staining, small fragments                         of stool and/or opaque liquid, but mucosa seen well).                         The total BBPS score equals 6. The quality of the                         bowel preparation was good. The terminal ileum,  ileocecal valve, appendiceal orifice, and rectum were                         photographed. Findings:      Hemorrhoids were found on perianal exam.      The digital rectal exam was normal. Pertinent negatives include normal       sphincter tone.      The terminal ileum appeared normal. Estimated blood loss: none.      Two sessile polyps were found in the transverse colon. The polyps were 2       to 3 mm in size. These polyps were removed with a cold snare. Resection       and retrieval were complete. Estimated blood loss was minimal.      Four sessile polyps were found in the descending colon (1), transverse       colon (1) and ascending colon (2). The polyps were 1 to 2 mm in size.       These polyps were removed with a jumbo cold forceps. Resection and       retrieval were complete. Estimated blood loss was minimal.      Multiple small-mouthed diverticula were found in the entire colon.       Estimated blood loss: none.      There was significant spasm in the entire colon. Estimated blood loss:       none.      A tattoo was seen in the rectum. A post-polypectomy scar was found at       the tattoo site. Estimated blood loss: none.      The exam was  otherwise without abnormality on direct and retroflexion       views. Impression:            - Hemorrhoids found on perianal exam.                        - The examined portion of the ileum was normal.                        - Two 2 to 3 mm polyps in the transverse colon,                         removed with a cold snare. Resected and retrieved.                        - Four 1 to 2 mm polyps in the descending colon, in                         the transverse colon and in the ascending colon,                         removed with a jumbo cold forceps. Resected and                         retrieved.                        - Diverticulosis in the entire examined colon.                        - Significant colonic spasm  consistent with irritable                         bowel syndrome.                        - A tattoo was seen in the rectum. A post-polypectomy                         scar was found at the tattoo site.                        - The examination was otherwise normal on direct and                         retroflexion views. Recommendation:        - Patient has a contact number available for                         emergencies. The signs and symptoms of potential                         delayed complications were discussed with the patient.                         Return to normal activities tomorrow. Written                         discharge instructions were provided to the patient.                        - Discharge patient to home.                        - Resume previous diet.                        - Continue present medications.                        - Resume Eliquis  (apixaban ) at prior dose in 2 days.                         Refer to managing physician for further adjustment of                         therapy.                        - Await pathology results.                        - Repeat colonoscopy for surveillance based on                         pathology results.                         - Return to GI office as previously scheduled.                        -  The findings and recommendations were discussed with                         the patient. Procedure Code(s):     --- Professional ---                        [id number], Colonoscopy, flexible; with removal of                         tumor(s), polyp(s), or other lesion(s) by snare                         technique                        45380, 59, Colonoscopy, flexible; with biopsy, single                         or multiple Diagnosis Code(s):     --- Professional ---                        Z86.010, Personal history of colonic polyps                        K64.9, Unspecified hemorrhoids                        K58.9, Irritable bowel syndrome without diarrhea                        D12.4, Benign neoplasm of descending colon                        D12.3, Benign neoplasm of transverse colon (hepatic                         flexure or splenic flexure)                        D12.2, Benign neoplasm of ascending colon                        K57.30, Diverticulosis of large intestine without                         perforation or abscess without bleeding CPT copyright 2022 American [employer]. All rights reserved. The codes documented in this report are preliminary and upon coder review may  be revised to meet current compliance requirements. Attending Participation:      I personally performed the entire procedure. Elspeth Jungling, DO [name] DO, DO 11/15/2024 9:15:22 AM This report has been signed electronically. Number of Addenda: 0 Note Initiated On: 11/15/2024 8:18 AM Scope Withdrawal Time: 0 hours 10 minutes 16 seconds  Total Procedure Duration: 0 hours 20 minutes 37 seconds  Estimated Blood Loss:  Estimated blood loss was minimal.      [facility]

## 2024-11-15 NOTE — Transfer of Care (Signed)
 Immediate Anesthesia Transfer of Care Note  Patient: Brian [name]  Procedure(s) Performed: COLONOSCOPY EGD (ESOPHAGOGASTRODUODENOSCOPY) POLYPECTOMY, INTESTINE  Patient Location: PACU  Anesthesia Type:General  Level of Consciousness: awake and sedated  Airway & Oxygen Therapy: Patient Spontanous Breathing and Patient connected to face mask oxygen  Post-op Assessment: Report given to RN and Post -op Vital signs reviewed and stable  Post vital signs: Reviewed  Last Vitals:  Vitals Value Taken Time  BP    Temp    Pulse    Resp    SpO2      Last Pain:  Vitals:   11/15/24 0740  TempSrc: Temporal  PainSc: 0-No pain         Complications: There were no known notable events for this encounter.

## 2024-11-15 NOTE — Anesthesia Postprocedure Evaluation (Signed)
"   Anesthesia Post Note  Patient: Brian [name]  Procedure(s) Performed: COLONOSCOPY EGD (ESOPHAGOGASTRODUODENOSCOPY) POLYPECTOMY, INTESTINE  Patient location during evaluation: PACU Anesthesia Type: General Level of consciousness: awake and alert Pain management: pain level controlled Vital Signs Assessment: post-procedure vital signs reviewed and stable Respiratory status: spontaneous breathing, nonlabored ventilation and respiratory function stable Cardiovascular status: blood pressure returned to baseline and stable Postop Assessment: no apparent nausea or vomiting Anesthetic complications: no   There were no known notable events for this encounter.   Last Vitals:  Vitals:   11/15/24 0928 11/15/24 0938  BP: (!) 148/80 (!) 144/77  Pulse: 62 (!) 59  Resp: 16 11  Temp:    SpO2: 100% 100%    Last Pain:  Vitals:   11/15/24 0928  TempSrc:   PainSc: 0-No pain                 Camellia Merilee Louder      "

## 2024-11-15 NOTE — Interval H&P Note (Signed)
 History and Physical Interval Note: Preprocedure H&P from 11/15/2024  was reviewed and there was no interval change after seeing and examining the patient.  Written consent was obtained from the patient after discussion of risks, benefits, and alternatives. Patient has consented to proceed with Esophagogastroduodenoscopy and Colonoscopy with possible intervention   11/15/2024 8:13 AM  Brian [name]  has presented today for surgery, with the diagnosis of Duodenal ulcer (K26.9) Adenomatous polyp of colon, unspecified part of colon (D12.6).  The various methods of treatment have been discussed with the patient and family. After consideration of risks, benefits and other options for treatment, the patient has consented to  Procedures: COLONOSCOPY (N/A) EGD (ESOPHAGOGASTRODUODENOSCOPY) (N/A) as a surgical intervention.  The patient's history has been reviewed, patient examined, no change in status, stable for surgery.  I have reviewed the patient's chart and labs.  Questions were answered to the patient's satisfaction.     [name]

## 2024-11-15 NOTE — Anesthesia Preprocedure Evaluation (Signed)
"                                    Anesthesia Evaluation  Patient identified by MRN, date of birth, ID band Patient awake    Reviewed: Allergy & Precautions, H&P , NPO status , Patient's Chart, lab work & pertinent test results, reviewed documented beta blocker date and time   History of Anesthesia Complications Negative for: history of anesthetic complications  Airway Mallampati: III  TM Distance: >3 FB Neck ROM: full    Dental  (+) Dental Advidsory Given, Missing, Poor Dentition   Pulmonary neg pulmonary ROS   Pulmonary exam normal breath sounds clear to auscultation       Cardiovascular Exercise Tolerance: Good hypertension, (-) angina + DVT  (-) Past MI and (-) Cardiac Stents Normal cardiovascular exam(-) dysrhythmias (-) Valvular Problems/Murmurs Rhythm:regular Rate:Normal     Neuro/Psych negative neurological ROS  negative psych ROS   GI/Hepatic negative GI ROS, Neg liver ROS,,,  Endo/Other  negative endocrine ROS    Renal/GU Renal disease (kidney stones)  negative genitourinary   Musculoskeletal   Abdominal   Peds  Hematology negative hematology ROS (+)   Anesthesia Other Findings Past Medical History: No date: Hypertension No date: Kidney stones   Reproductive/Obstetrics negative OB ROS                              Anesthesia Physical Anesthesia Plan  ASA: 2  Anesthesia Plan: General   Post-op Pain Management:    Induction: Intravenous  PONV Risk Score and Plan: 2 and Propofol  infusion, TIVA and Treatment may vary due to age or medical condition  Airway Management Planned: Natural Airway and Nasal Cannula  Additional Equipment:   Intra-op Plan:   Post-operative Plan:   Informed Consent: I have reviewed the patients History and Physical, chart, labs and discussed the procedure including the risks, benefits and alternatives for the proposed anesthesia with the patient or authorized representative  who has indicated his/her understanding and acceptance.     Dental Advisory Given  Plan Discussed with: Anesthesiologist, CRNA and Surgeon  Anesthesia Plan Comments:          Anesthesia Quick Evaluation  "

## 2024-11-15 NOTE — Op Note (Signed)
 [facility] Gastroenterology Patient Name: Brian [name] Procedure Date: 11/15/2024 8:18 AM MRN: [id number] Account #: [id number] Date of Birth: December 20, 1960 Admit Type: Outpatient Age: 63 Room: [facility] ENDO ROOM 2 Gender: Male Note Status: Finalized Instrument Name: Endoscope [id number] Procedure:             Upper GI endoscopy Indications:           duodenal ulcer; abnormal CT Providers:             [name] DO, DO Referring MD:          Tamra Leventhal, MD (Referring MD) Medicines:             Monitored Anesthesia Care Complications:         No immediate complications. Estimated blood loss:                         Minimal. Procedure:             Pre-Anesthesia Assessment:                        - Prior to the procedure, a History and Physical was                         performed, and patient medications and allergies were                         reviewed. The patient is competent. The risks and                         benefits of the procedure and the sedation options and                         risks were discussed with the patient. All questions                         were answered and informed consent was obtained.                         Patient identification and proposed procedure were                         verified by the physician, the nurse, the anesthetist                         and the technician in the endoscopy suite. Mental                         Status Examination: alert and oriented. Airway                         Examination: normal oropharyngeal airway and neck                         mobility. Respiratory Examination: clear to                         auscultation. CV Examination: RRR, no murmurs, no S3  or S4. Prophylactic Antibiotics: The patient does not                         require prophylactic antibiotics. Prior                         Anticoagulants: The patient has taken no anticoagulant                          or antiplatelet agents. ASA Grade Assessment: III - A                         patient with severe systemic disease. After reviewing                         the risks and benefits, the patient was deemed in                         satisfactory condition to undergo the procedure. The                         anesthesia plan was to use monitored anesthesia care                         (MAC). Immediately prior to administration of                         medications, the patient was re-assessed for adequacy                         to receive sedatives. The heart rate, respiratory                         rate, oxygen saturations, blood pressure, adequacy of                         pulmonary ventilation, and response to care were                         monitored throughout the procedure. The physical                         status of the patient was re-assessed after the                         procedure.                        After obtaining informed consent, the endoscope was                         passed under direct vision. Throughout the procedure,                         the patient's blood pressure, pulse, and oxygen                         saturations were monitored continuously. The Endoscope  was introduced through the mouth, and advanced to the                         third part of duodenum. The upper GI endoscopy was                         accomplished without difficulty. The patient tolerated                         the procedure well. Findings:      One non-bleeding cratered duodenal ulcer with a clean ulcer base       (Forrest Class III) was found in the first portion of the duodenum. The       lesion was 2 mm in largest dimension. Biopsies were taken with a cold       forceps for histology. Estimated blood loss was minimal.      Multiple localized erosions with stigmata of recent bleeding were found       in the duodenal bulb. Biopsies were  taken with a cold forceps for       histology. Estimated blood loss was minimal.      The exam of the duodenum was otherwise normal.      Localized mild inflammation characterized by erythema was found in the       gastric antrum. Biopsies were taken with a cold forceps for Helicobacter       pylori testing. Estimated blood loss was minimal.      A single 2 to 3 mm sessile polyp with no bleeding and no stigmata of       recent bleeding was found on the greater curvature of the stomach. The       polyp was removed with a cold biopsy forceps. Resection and retrieval       were complete. Estimated blood loss was minimal.      The exam of the stomach was otherwise normal.      Salmon-colored mucosa was present. C1M2. Biopsies were taken with a cold       forceps for histology. Estimated blood loss was minimal. 4 quadrant       biopsies taken with additional tongue of salmon colored mucosa biopsied-       same bottle.      Esophagogastric landmarks were identified: the gastroesophageal junction       was found at 40 cm from the incisors.      The exam of the esophagus was otherwise normal. Impression:            - Non-bleeding duodenal ulcer with a clean ulcer base                         (Forrest Class III). Biopsied.                        - Duodenal erosions with stigmata of recent bleeding.                         Biopsied.                        - Gastritis. Biopsied.                        -  A single gastric polyp. Resected and retrieved.                        - Salmon-colored mucosa suspicious for short-segment                         Barrett's esophagus. Biopsied.                        - Esophagogastric landmarks identified. Recommendation:        - Patient has a contact number available for                         emergencies. The signs and symptoms of potential                         delayed complications were discussed with the patient.                         Return to normal  activities tomorrow. Written                         discharge instructions were provided to the patient.                        - Discharge patient to home.                        - Resume previous diet.                        - Continue present medications.                        - see colonoscopy report for eliquis  restart                         recommendations.                        - Use Protonix  (pantoprazole ) 40 mg PO BID for 8 weeks.                        - Await pathology results.                        - Repeat upper endoscopy for surveillance based on                         pathology results.                        - Return to GI clinic as previously scheduled.                        - The findings and recommendations were discussed with                         the patient. Procedure Code(s):     --- Professional ---  56760, Esophagogastroduodenoscopy, flexible,                         transoral; with biopsy, single or multiple Diagnosis Code(s):     --- Professional ---                        K26.9, Duodenal ulcer, unspecified as acute or                         chronic, without hemorrhage or perforation                        K26.4, Chronic or unspecified duodenal ulcer with                         hemorrhage                        K29.70, Gastritis, unspecified, without bleeding                        K31.7, Polyp of stomach and duodenum                        K22.89, Other specified disease of esophagus CPT copyright 2022 American Medical Association. All rights reserved. The codes documented in this report are preliminary and upon coder review may  be revised to meet current compliance requirements. Attending Participation:      I personally performed the entire procedure. Elspeth Jungling, DO [name] DO, DO 11/15/2024 8:40:12 AM This report has been signed electronically. Number of Addenda: 0 Note Initiated On: 11/15/2024 8:18  AM Estimated Blood Loss:  Estimated blood loss was minimal.      [facility]

## 2024-11-15 NOTE — H&P (Signed)
 "  Pre-Procedure H&P   Patient ID: Brian [name] is a 63 y.o. male.  Gastroenterology Provider: [name], DO  PCP: Sadie Manna, MD  Date: 11/15/2024  HPI Mr. Brian [name] is a 63 y.o. male who presents today for Esophagogastroduodenoscopy and Colonoscopy for Abnormal CT, duodenal ulcer, personal history of colon polyps .  Patient was seen with chest pain in September of this year.  CT commented on duodenal ulcer versus contained perforation.  He was also taking ibuprofen  at that time.  Discovered to have a small pulmonary embolus and was placed on Eliquis .  He is now off Eliquis .  Takes Linzess  for constipation.  Last underwent colonoscopy in September 2024 with tubulovillous adenoma, scad, internal hemorrhoids and diverticulosis  June 2023 colonoscopy demonstrated large rectal TVA and he underwent TAMIS in 07/2022.   Past Medical History:  Diagnosis Date   Hypertension    Kidney stones     Past Surgical History:  Procedure Laterality Date   CHOLECYSTECTOMY     Reflux sugery     TONSILLECTOMY      Family History Paternal uncle- CRC No h/o GI disease or malignancy  Review of Systems  Constitutional:  Negative for activity change, appetite change, chills, diaphoresis, fatigue, fever and unexpected weight change.  HENT:  Negative for trouble swallowing and voice change.   Respiratory:  Negative for shortness of breath and wheezing.   Cardiovascular:  Negative for chest pain, palpitations and leg swelling.  Gastrointestinal:  Negative for abdominal distention, abdominal pain, anal bleeding, blood in stool, constipation, diarrhea, nausea and vomiting.  Musculoskeletal:  Negative for arthralgias and myalgias.  Skin:  Negative for color change and pallor.  Neurological:  Negative for dizziness, syncope and weakness.  Psychiatric/Behavioral:  Negative for confusion. The patient is not nervous/anxious.   All other systems reviewed and are negative.     Medications Medications Ordered Prior to Encounter[1]  Pertinent medications related to GI and procedure were reviewed by me with the patient prior to the procedure  Current Medications[2]  sodium chloride  20 mL/hr at 11/15/24 0750       Allergies[3] Allergies were reviewed by me prior to the procedure  Objective   Body mass index is 36.51 kg/m. Vitals:   11/15/24 0740  BP: (!) 151/86  Pulse: (!) 57  Resp: 18  Temp: (!) 97 F (36.1 C)  TempSrc: Temporal  SpO2: 100%  Weight: 129 kg  Height: 6' 2 (1.88 m)     Physical Exam Vitals and nursing note reviewed.  Constitutional:      General: He is not in acute distress.    Appearance: Normal appearance. He is obese. He is not ill-appearing, toxic-appearing or diaphoretic.  HENT:     Head: Normocephalic and atraumatic.     Nose: Nose normal.     Mouth/Throat:     Mouth: Mucous membranes are moist.     Pharynx: Oropharynx is clear.  Eyes:     General: No scleral icterus.    Extraocular Movements: Extraocular movements intact.  Cardiovascular:     Rate and Rhythm: Regular rhythm. Bradycardia present.     Heart sounds: Normal heart sounds. No murmur heard.    No friction rub. No gallop.  Pulmonary:     Effort: Pulmonary effort is normal. No respiratory distress.     Breath sounds: Normal breath sounds. No wheezing, rhonchi or rales.  Abdominal:     General: Bowel sounds are normal. There is no distension.  Palpations: Abdomen is soft.     Tenderness: There is no abdominal tenderness. There is no guarding or rebound.  Musculoskeletal:     Cervical back: Neck supple.     Right lower leg: No edema.     Left lower leg: No edema.  Skin:    General: Skin is warm and dry.     Coloration: Skin is not jaundiced or pale.  Neurological:     General: No focal deficit present.     Mental Status: He is alert and oriented to person, place, and time. Mental status is at baseline.  Psychiatric:        Mood and Affect:  Mood normal.        Behavior: Behavior normal.        Thought Content: Thought content normal.        Judgment: Judgment normal.      Assessment:  Mr. Brian [name] is a 63 y.o. male  who presents today for Esophagogastroduodenoscopy and Colonoscopy for Abnormal CT, duodenal ulcer, personal history of colon polyps .  Plan:  Esophagogastroduodenoscopy and Colonoscopy with possible intervention today  Esophagogastroduodenoscopy and Colonoscopy with possible biopsy, control of bleeding, polypectomy, and interventions as necessary has been discussed with the patient/patient representative. Informed consent was obtained from the patient/patient representative after explaining the indication, nature, and risks of the procedure including but not limited to death, bleeding, perforation, missed neoplasm/lesions, cardiorespiratory compromise, and reaction to medications. Opportunity for questions was given and appropriate answers were provided. Patient/patient representative has verbalized understanding is amenable to undergoing the procedure.   [name], DO  [facility]  Portions of the record may have been created with voice recognition software. Occasional wrong-word or 'sound-a-like' substitutions may have occurred due to the inherent limitations of voice recognition software.  Read the chart carefully and recognize, using context, where substitutions may have occurred.    [1]  No current facility-administered medications on file prior to encounter.   Current Outpatient Medications on File Prior to Encounter  Medication Sig Dispense Refill   APIXABAN  (ELIQUIS ) VTE STARTER PACK (10MG  AND 5MG ) Take as directed on package: start with two-5mg  tablets twice daily for 7 days. On day 8, switch to one-5mg  tablet twice daily. 74 each 0   escitalopram  (LEXAPRO ) 10 MG tablet Take 10 mg by mouth daily.     hydrochlorothiazide (HYDRODIURIL) 12.5 MG tablet Take 12.5 mg by  mouth daily.     linaclotide  (LINZESS ) 145 MCG CAPS capsule Take 145 mcg by mouth daily as needed.     lisinopril (PRINIVIL,ZESTRIL) 40 MG tablet Take 40 mg by mouth daily.     pantoprazole  (PROTONIX ) 40 MG tablet Take 1 tablet (40 mg total) by mouth 2 (two) times daily for 60 days, THEN 1 tablet (40 mg total) daily. 30 tablet 1   Vitamin D , Ergocalciferol , (DRISDOL) 1.25 MG (50000 UNIT) CAPS capsule Take 50,000 Units by mouth every 7 (seven) days. Monday    [2]  Current Facility-Administered Medications:    0.9 %  sodium chloride  infusion, , Intravenous, Continuous, Jungling Elspeth Ozell, DO, Last Rate: 20 mL/hr at 11/15/24 0750, New Bag at 11/15/24 0750 [3] No Known Allergies  "

## 2024-11-16 ENCOUNTER — Encounter: Payer: Self-pay | Admitting: Gastroenterology

## 2024-11-16 LAB — SURGICAL PATHOLOGY

## 2024-11-17 ENCOUNTER — Other Ambulatory Visit (HOSPITAL_COMMUNITY): Payer: Self-pay

## 2024-12-23 ENCOUNTER — Encounter: Payer: Self-pay | Admitting: Oncology

## 2024-12-24 ENCOUNTER — Inpatient Hospital Stay: Payer: PRIVATE HEALTH INSURANCE | Admitting: Oncology

## 2025-01-25 ENCOUNTER — Inpatient Hospital Stay: Payer: PRIVATE HEALTH INSURANCE | Admitting: Oncology
